# Patient Record
Sex: Female | Born: 1990 | Race: White | Hispanic: No | Marital: Single | Smoking: Current every day smoker
Health system: Southern US, Community
[De-identification: ages and names within clinical notes are randomized; demographics above are authoritative.]

## PROBLEM LIST (undated history)

## (undated) ENCOUNTER — Inpatient Hospital Stay (HOSPITAL_COMMUNITY): Payer: Self-pay

## (undated) DIAGNOSIS — IMO0002 Reserved for concepts with insufficient information to code with codable children: Secondary | ICD-10-CM

## (undated) DIAGNOSIS — O210 Mild hyperemesis gravidarum: Secondary | ICD-10-CM

## (undated) DIAGNOSIS — N730 Acute parametritis and pelvic cellulitis: Secondary | ICD-10-CM

## (undated) DIAGNOSIS — R87619 Unspecified abnormal cytological findings in specimens from cervix uteri: Secondary | ICD-10-CM

## (undated) HISTORY — PX: KNEE SURGERY: SHX244

## (undated) HISTORY — PX: WISDOM TOOTH EXTRACTION: SHX21

## (undated) HISTORY — PX: COLPOSCOPY VULVA W/ BIOPSY: SUR282

---

## 2011-10-08 ENCOUNTER — Emergency Department (HOSPITAL_COMMUNITY): Payer: Medicaid - Out of State

## 2011-10-08 ENCOUNTER — Emergency Department (HOSPITAL_COMMUNITY)
Admission: EM | Admit: 2011-10-08 | Discharge: 2011-10-09 | Disposition: A | Discharge status: Home / Self Care | Payer: Medicaid - Out of State | Attending: Emergency Medicine | Admitting: Emergency Medicine

## 2011-10-08 ENCOUNTER — Encounter (HOSPITAL_COMMUNITY): Payer: Self-pay | Admitting: *Deleted

## 2011-10-08 DIAGNOSIS — F17200 Nicotine dependence, unspecified, uncomplicated: Secondary | ICD-10-CM | POA: Insufficient documentation

## 2011-10-08 DIAGNOSIS — M795 Residual foreign body in soft tissue: Secondary | ICD-10-CM | POA: Insufficient documentation

## 2011-10-08 DIAGNOSIS — Z1889 Other specified retained foreign body fragments: Secondary | ICD-10-CM | POA: Insufficient documentation

## 2011-10-08 DIAGNOSIS — R0789 Other chest pain: Secondary | ICD-10-CM

## 2011-10-08 DIAGNOSIS — R071 Chest pain on breathing: Secondary | ICD-10-CM | POA: Insufficient documentation

## 2011-10-08 DIAGNOSIS — Z789 Other specified health status: Secondary | ICD-10-CM

## 2011-10-08 LAB — PREGNANCY, URINE: Preg Test, Ur: NEGATIVE

## 2011-10-08 MED ORDER — HYDROCODONE-ACETAMINOPHEN 5-325 MG PO TABS: 1.0000 | ORAL_TABLET | ORAL | Status: AC | PRN

## 2011-10-08 MED ORDER — CEPHALEXIN 500 MG PO CAPS: 500.0000 mg | ORAL_CAPSULE | Freq: Four times a day (QID) | ORAL | Status: AC

## 2011-10-08 NOTE — ED Notes (Signed)
Pt. Also c/o infected dermal piercing on left lower back.

## 2011-10-08 NOTE — ED Notes (Signed)
The pt has had lt lower rib pain for 3-4 months no known injury.  She also wants a mole checked on her  Back and she wants a preg test.  lmp aug 7

## 2011-10-08 NOTE — ED Notes (Signed)
Prescriptions x2 given with discharge instructions.  

## 2011-10-08 NOTE — ED Provider Notes (Signed)
History     CSN: [id number]  Arrival date & time 10/08/11  7425   First MD Initiated Contact with Patient 10/08/11 2028      Chief Complaint  Patient presents with  . rib pain mole infected     (Consider location/radiation/quality/duration/timing/severity/associated sxs/prior treatment) HPI History provided by pt.   Pt noticed that her left lower rib cage was sticking out more than the right a few months ago.  She has had progressively worsening pain in this location.  Occasionally radiates up center of chest.  No associated fever, cough or dyspnea.  Denies trauma.  No RF for PE and denies LE pain/edema.  Has a h/o cervical dysplasia but has never been diagnosed w/ cancer.  Also c/o infected piercing of left lower back.  Was pierced 6 months ago. Has had pain for the past several days.  Denies drainage.  Has been told that it will need to be removed by a general surgeon.    History reviewed. No pertinent past medical history.  History reviewed. No pertinent past surgical history.  No family history on file.  History  Substance Use Topics  . Smoking status: Current Everyday Smoker  . Smokeless tobacco: Not on file  . Alcohol Use: Yes    OB History    Grav Para Term Preterm Abortions TAB SAB Ect Mult Living                  Review of Systems  All other systems reviewed and are negative.    Allergies  Ibuprofen and Kiwi extract  Home Medications  No current outpatient prescriptions on file.  BP 114/59  Pulse 112  Temp 98.5 F (36.9 C) (Oral)  Resp 16  SpO2 96%  LMP 09/04/2011  Physical Exam  Nursing note and vitals reviewed. Constitutional: She is oriented to person, place, and time. She appears well-developed and well-nourished. No distress.  HENT:  Head: Normocephalic and atraumatic.  Eyes:       Normal appearance  Neck: Normal range of motion.  Cardiovascular: Normal rate and regular rhythm.   Pulmonary/Chest: Effort normal and breath sounds normal. No  respiratory distress.       No tenderness of chest wall  Abdominal: Soft. Bowel sounds are normal. She exhibits no distension.       Mild tenderness epigastrium only  Musculoskeletal: Normal range of motion. Tenderness: ENtire.       No edema/ttp lower extremities  Neurological: She is alert and oriented to person, place, and time.  Skin: Skin is warm and dry. No rash noted.       No rash.  Stud piercing in left and right of lumbar spine.  Left piercing w/ narrow ring of surrounding erythema.  No drainage.  No induration.  Ttp.   Psychiatric: She has a normal mood and affect. Her behavior is normal.    ED Course  FOREIGN BODY REMOVAL Date/Time: 10/09/2011 7:59 AM Performed by: Otilio Miu Authorized by: Ruby Cola E Consent: Verbal consent obtained. Risks and benefits: risks, benefits and alternatives were discussed Consent given by: patient Patient understanding: patient states understanding of the procedure being performed Body area: skin General location: trunk Location details: back Anesthesia: local infiltration Local anesthetic: lidocaine 2% with epinephrine Patient sedated: no Removal mechanism: scalpel Dressing: dressing applied Depth: subcutaneous Complexity: simple 1 objects recovered. Post-procedure assessment: foreign body removed Patient tolerance: Patient tolerated the procedure well with no immediate complications.   (including critical care time)   Labs  Reviewed  PREGNANCY, URINE   Dg Abd Acute W/chest  10/08/2011  *RADIOLOGY REPORT*  Clinical Data: Left lower rib pain.  Infected piercing in the left lower back.  No injury.  ACUTE ABDOMEN SERIES (ABDOMEN 2 VIEW & CHEST 1 VIEW)  Comparison: None.  Findings: Normal heart size and pulmonary vascularity.  Scattered calcified granulomas in the lungs with calcified hilar and right paratracheal lymph nodes.  No focal airspace consolidation in the lungs.  No blunting of costophrenic angles.  No  pneumothorax.  Normal bowel gas pattern with scattered gas and stool in the colon. No small or large bowel distension.  No radiopaque stones. Metallic foreign bodies projected over the sacral ala bilaterally. Calcifications in the pelvis consistent with phleboliths.  No free air in the abdomen.  No abnormal air fluid levels.  IMPRESSION: Calcified granulomas in the lungs and hilar/mediastinal lymph nodes.  No evidence of active pulmonary disease.  Nonobstructive bowel gas pattern.   Original Report Authenticated By: Marlon Pel, M.D.      1. Body piercing   2. Chest wall pain       MDM  21yo healthy F presents w/ c/o non-traumatic L lower anterior rib pain x months.  No associated sx.  Pain is not reproducible on exam.  Xray shows scattered granulomas and calcified lymph nodes but nothing acute to explain pain.  Recommended PCP f/u.  Also c/o infected left low back piercing.  I made a small incision to remove the piercing and closed loosely w/ single suture.  Signs of worsening infection that should prompt her return were discussed.  D/c'd home w/ 8 vicodin for pain and keflex.          Otilio Miu, [place] 10/09/11 [phone]

## 2011-10-08 NOTE — ED Provider Notes (Signed)
21 year old female with an infection around a piercing in the left lower back. On exam, there is moderate erythema surrounding the piercing but no significant swelling and no drainage. There is quite tender to palpation. X-ray shows that there is a cross piece of the study is attached to. Patient is advised that a small incision would need to be made to remove the piercing. She states that she does not wish to have that done today. She is advised the infection will not get better until the piercing was removed. She states she is aware that still wishes to go home without the piercing been removed. She will be referred to general surgery for followup. She'll be placed on antibiotics.  Kristie Booze, MD 10/08/11 [phone]

## 2011-10-08 NOTE — ED Notes (Signed)
Pt. To x-ray .

## 2011-10-09 NOTE — ED Provider Notes (Signed)
Medical screening examination/treatment/procedure(s) were conducted as a shared visit with non-physician practitioner(s) and myself.  I personally evaluated the patient during the encounter   Dione Booze, MD 10/09/11 2355

## 2011-10-22 ENCOUNTER — Encounter (HOSPITAL_COMMUNITY): Payer: Self-pay | Admitting: *Deleted

## 2011-10-22 ENCOUNTER — Emergency Department (HOSPITAL_COMMUNITY)
Admission: EM | Admit: 2011-10-22 | Discharge: 2011-10-22 | Disposition: A | Discharge status: Home / Self Care | Payer: Medicaid - Out of State | Attending: Emergency Medicine | Admitting: Emergency Medicine

## 2011-10-22 DIAGNOSIS — F17200 Nicotine dependence, unspecified, uncomplicated: Secondary | ICD-10-CM | POA: Insufficient documentation

## 2011-10-22 DIAGNOSIS — R109 Unspecified abdominal pain: Secondary | ICD-10-CM | POA: Insufficient documentation

## 2011-10-22 LAB — URINALYSIS, ROUTINE W REFLEX MICROSCOPIC
Bilirubin Urine: NEGATIVE
Ketones, ur: NEGATIVE mg/dL
Nitrite: NEGATIVE
Urobilinogen, UA: 0.2 mg/dL (ref 0.0–1.0)
pH: 7 (ref 5.0–8.0)

## 2011-10-22 NOTE — ED Notes (Addendum)
Patient had piercing removed a few weeks that was on her backgo and was given antibiotics and now she is experiencing left side pain and right side pain.  Patient also c/o shortness of breath.

## 2011-10-22 NOTE — ED Provider Notes (Signed)
History     CSN: [id number]  Arrival date & time 10/22/11  1842   First MD Initiated Contact with Patient 10/22/11 2004      Chief Complaint  Patient presents with  . Flank Pain   HPI  History provided by the patient. History is limited given patient's unwillingness to be fully evaluated. Patient is a 21 year old female with no significant PMH who presents with complaints of left sided back pain. Patient reports having symptoms off and on for the past several weeks. She denies any injury trauma to the area. She reports that pain is worse with deep breathing. She denies feeling symptoms of shortness of breath. Denies any skin changes. She denies any fever, chills or sweats. She denies any urinary changes. Denies any diarrhea or constipation. Patient has not used any treatment for symptoms. She denies any aggravating or alleviating factors.    History reviewed. No pertinent past medical history.  Past Surgical History  Procedure Date  . Knee surgery     right side    History reviewed. No pertinent family history.  History  Substance Use Topics  . Smoking status: Current Every Day Smoker -- 0.5 packs/day    Types: Cigarettes  . Smokeless tobacco: Not on file  . Alcohol Use: Yes    OB History    Grav Para Term Preterm Abortions TAB SAB Ect Mult Living                  Review of Systems  Constitutional: Negative for fever and chills.  Respiratory: Negative for shortness of breath.   Cardiovascular: Positive for chest pain.  Gastrointestinal: Positive for abdominal pain. Negative for nausea, vomiting, diarrhea and constipation.  Genitourinary: Positive for flank pain. Negative for dysuria, frequency and hematuria.  Musculoskeletal: Positive for back pain.    Allergies  Ibuprofen and Kiwi extract  Home Medications  No current outpatient prescriptions on file.  BP 122/67  Pulse 97  Temp 97.9 F (36.6 C) (Oral)  SpO2 100%  LMP 09/04/2011  Physical Exam  Nursing  note and vitals reviewed. Constitutional: She is oriented to person, place, and time. She appears well-developed and well-nourished. No distress.  HENT:  Head: Normocephalic.  Cardiovascular: Normal rate and regular rhythm.   No murmur heard. Pulmonary/Chest: Effort normal and breath sounds normal. No respiratory distress. She has no wheezes. She has no rales.       Mild left lower lateral rib tenderness without deformity or step-off. Skin normal without rash or bruising.  Abdominal: Soft. There is tenderness in the left upper quadrant and left lower quadrant. There is no rebound and no guarding.       No significant CVA tenderness.  Musculoskeletal: She exhibits no edema and no tenderness.       No clinical signs for DVT  Neurological: She is alert and oriented to person, place, and time.  Skin: Skin is warm and dry. No rash noted.  Psychiatric: She has a normal mood and affect. Her behavior is normal.    ED Course  Procedures    Labs Reviewed  URINALYSIS, ROUTINE W REFLEX MICROSCOPIC   No results found.   1. Left flank pain       MDM  9:15PM patient seen and evaluated. I discussed options for further evaluation of patient's chronic symptoms however she declines and does not wish to stay for further evaluation. Patient states she needs to return home and catch the bus. I discussed with patient rare but possible  concerning cause of symptoms including PE, kidney stones, kidney or pancreas problems or infections. She understands that the risks of some of these conditions could be severe including life-threatening. Patient was advised she may return at anytime for reevaluation of symptoms.       Angus Seller, [place] 10/22/11 2219

## 2011-10-22 NOTE — ED Notes (Signed)
Patient left AMA. Was not willing to wait for provider, D[place]en PA made aware.

## 2011-10-23 ENCOUNTER — Emergency Department (HOSPITAL_COMMUNITY)
Admission: EM | Admit: 2011-10-23 | Discharge: 2011-10-23 | Disposition: A | Discharge status: Home / Self Care | Payer: Medicaid - Out of State | Attending: Emergency Medicine | Admitting: Emergency Medicine

## 2011-10-23 ENCOUNTER — Encounter (HOSPITAL_COMMUNITY): Payer: Self-pay | Admitting: Emergency Medicine

## 2011-10-23 ENCOUNTER — Emergency Department (HOSPITAL_COMMUNITY): Payer: Medicaid - Out of State

## 2011-10-23 DIAGNOSIS — F17200 Nicotine dependence, unspecified, uncomplicated: Secondary | ICD-10-CM | POA: Insufficient documentation

## 2011-10-23 DIAGNOSIS — R091 Pleurisy: Secondary | ICD-10-CM

## 2011-10-23 DIAGNOSIS — Z91018 Allergy to other foods: Secondary | ICD-10-CM | POA: Insufficient documentation

## 2011-10-23 DIAGNOSIS — Z888 Allergy status to other drugs, medicaments and biological substances status: Secondary | ICD-10-CM | POA: Insufficient documentation

## 2011-10-23 MED ORDER — HYDROCODONE-ACETAMINOPHEN 5-325 MG PO TABS
2.0000 | ORAL_TABLET | Freq: Once | ORAL | Status: AC
  Administered 2011-10-23: 2 via ORAL
  Filled 2011-10-23: qty 2

## 2011-10-23 MED ORDER — HYDROCODONE-ACETAMINOPHEN 5-325 MG PO TABS: 1.0000 | ORAL_TABLET | ORAL | Status: DC | PRN

## 2011-10-23 NOTE — ED Notes (Addendum)
Pt states that she has had pain upon inspiration for the past week.  States it gets better when she smokes a cigarette.  Went to [facility] twice in the past week.  States she was unhappy with her care because they did a urine sample and it didn't have anything to do with her urine.  Pt has no visible distress.  Calmly talking in triage.  C/o pain 9/10.

## 2011-10-23 NOTE — ED Provider Notes (Signed)
Medical screening examination/treatment/procedure(s) were performed by non-physician practitioner and as supervising physician I was immediately available for consultation/collaboration.  Gerhard Munch, MD 10/23/11 [phone]

## 2011-10-23 NOTE — ED Notes (Signed)
Pt with pain in her chest when breathing in and out.  She has had this pain for one and one half weeks.  Pt is a smoker 1/2 ppd.  No history of asthma.  Denies fever, chills but says she has felt hot lately?  Pt coughing up greenish sputum.  Denies nausea, vomiting.

## 2011-10-23 NOTE — ED Provider Notes (Signed)
History     CSN: [id number]  Arrival date & time 10/23/11  1755   First MD Initiated Contact with Patient 10/23/11 2006      Chief Complaint  Patient presents with  . Shortness of Breath    (Consider location/radiation/quality/duration/timing/severity/associated sxs/prior treatment) HPI History provided by pt and prior chart.  Pt presents w/ 1.5 weeks of constant, sharp, pleuritic left anterior lower chest pain.  Has had this pain intermittently for several months but before this episode, has resolved spontaneously in a short amount of time.   Associated w/ cough x 1 month.  Denies fever, SOB, palpitations, back pain, urinary sx, abd pain, N/V/D, LE pain edema.  Has had some relief w/ vicodin that was prescribed to a friend but the only thing that resolves the pain is smoking a cigarette.  Pain is not aggravated by movement or position.  No RF for PE.  Denies trauma/heavy lifting.  Per prior chart, pt seen for same compliant in ED yesterday but refused to be completely evaluated.  She states that the PA checked her urine and her pain has nothing to do with her urinary tract.    History reviewed. No pertinent past medical history.  Past Surgical History  Procedure Date  . Knee surgery     right side    History reviewed. No pertinent family history.  History  Substance Use Topics  . Smoking status: Current Every Day Smoker -- 0.5 packs/day    Types: Cigarettes  . Smokeless tobacco: Not on file  . Alcohol Use: Yes    OB History    Grav Para Term Preterm Abortions TAB SAB Ect Mult Living                  Review of Systems  All other systems reviewed and are negative.    Allergies  Ibuprofen and Kiwi extract  Home Medications  No current outpatient prescriptions on file.  BP 106/56  Pulse 69  Temp 98.6 F (37 C) (Oral)  Resp 16  SpO2 98%  LMP 10/11/2011  Physical Exam  Nursing note and vitals reviewed. Constitutional: She is oriented to person, place, and  time. She appears well-developed and well-nourished. No distress.       Pt is holding her left side and is talking quietly as if she is afraid it will increase her pain.   HENT:  Head: Normocephalic and atraumatic.  Eyes:       Normal appearance  Neck: Normal range of motion.  Cardiovascular: Normal rate and regular rhythm.   Pulmonary/Chest: Breath sounds normal. No respiratory distress. She exhibits no tenderness.       Exam limited because patient can not take a deep breath.  No bruising or other skin changes.   Abdominal: Soft. Bowel sounds are normal. She exhibits no distension and no mass. There is no tenderness. There is no rebound and no guarding.  Genitourinary:       No CVA tenderness  Musculoskeletal: Normal range of motion.       No LE edema or tenderness  Neurological: She is alert and oriented to person, place, and time.  Skin: Skin is warm and dry. No rash noted.  Psychiatric: She has a normal mood and affect. Her behavior is normal.    ED Course  Procedures (including critical care time)   Date: 10/23/2011  Rate: 66  Rhythm: normal sinus rhythm  QRS Axis: normal  Intervals: normal  ST/T Wave abnormalities: normal  Conduction Disutrbances:none  Narrative Interpretation:   Old EKG Reviewed: none available    Labs Reviewed  D-DIMER, QUANTITATIVE   Dg Chest 2 View  10/23/2011  *RADIOLOGY REPORT*  Clinical Data: 21 year old female with left side pain.  Shortness of breath.  CHEST - 2 VIEW  Comparison: 10/08/2011.  Findings: Scattered pulmonary calcified granulomas and small calcified lymph node again noted.  Slightly more prominent calcified right perihilar node again noted. Other mediastinal contours are within normal limits.  Visualized tracheal air column is within normal limits.  No pneumothorax, pulmonary edema, pleural effusion or confluent pulmonary opacity. No acute osseous abnormality identified.  IMPRESSION: Previous thoracic granulomatous disease. No acute  cardiopulmonary abnormality.   Original Report Authenticated By: Harley Hallmark, M.D.      1. Pleurisy       MDM  Healthy 21yo F presents w/ non-traumatic left anterior chest pain that is sharp and pleuritic.  No associated sx.  No RF for or exam findings consistent w/ PE.  Will r/o with d-dimer.  CXR pending to r/o pneumonia, pneumothorax, rib fracture.  EKG has been ordered as well.  No GI sx and abd benign.  Pt to receive 2 vicodin for pain.  8:48 PM    EKG non-ischemic and shows NSR.  CXR stable and d-dimer neg.  All results discussed w/ pt.  She reports that pain has improved w/ hydrocodone.  Will treat w/ NSAIDs for pleurisy and recommend rest and PCP f/u.  Return precautions discussed.         Otilio Miu, [place] 10/24/11 [phone]

## 2011-10-25 NOTE — ED Provider Notes (Signed)
Medical screening examination/treatment/procedure(s) were performed by non-physician practitioner and as supervising physician I was immediately available for consultation/collaboration.  Raeford Razor, MD 10/25/11 1204

## 2011-11-10 ENCOUNTER — Encounter (HOSPITAL_COMMUNITY): Payer: Self-pay | Admitting: Emergency Medicine

## 2011-11-10 ENCOUNTER — Emergency Department (INDEPENDENT_AMBULATORY_CARE_PROVIDER_SITE_OTHER)
Admission: EM | Admit: 2011-11-10 | Discharge: 2011-11-10 | Disposition: A | Discharge status: Home / Self Care | Payer: Medicaid - Out of State | Source: Home / Self Care

## 2011-11-10 DIAGNOSIS — R079 Chest pain, unspecified: Secondary | ICD-10-CM

## 2011-11-10 DIAGNOSIS — J984 Other disorders of lung: Secondary | ICD-10-CM

## 2011-11-10 MED ORDER — KETOROLAC TROMETHAMINE 60 MG/2ML IM SOLN
60.0000 mg | Freq: Once | INTRAMUSCULAR | Status: AC
  Administered 2011-11-10: 60 mg via INTRAMUSCULAR

## 2011-11-10 MED ORDER — TRAMADOL HCL 50 MG PO TABS: 50.0000 mg | ORAL_TABLET | Freq: Four times a day (QID) | ORAL | Status: DC | PRN

## 2011-11-10 MED ORDER — KETOROLAC TROMETHAMINE 60 MG/2ML IM SOLN
INTRAMUSCULAR | Status: AC
  Filled 2011-11-10: qty 2

## 2011-11-10 NOTE — ED Provider Notes (Signed)
Medical screening examination/treatment/procedure(s) were performed by resident physician or non-physician practitioner and as supervising physician I was immediately available for consultation/collaboration.   Barkley Bruns MD.    Linna Hoff, MD 11/10/11 [phone]

## 2011-11-10 NOTE — ED Notes (Signed)
Pt has had left chest pain x several months. Has been to [facility] X2 and [facility] x 2 for same. States gets pain meds but does not get any better.

## 2011-11-10 NOTE — ED Provider Notes (Signed)
History     CSN: [id number]  Arrival date & time 11/10/11  0951   None     Chief Complaint  Patient presents with  . Chest Pain    (Consider location/radiation/quality/duration/timing/severity/associated sxs/prior treatment) HPI Comments: 21 year old female who presents with pain in the left chest. She's had this complaint for several months and is visited [facility] twice in [place] twice and has had appropriate workups. Including chest x-rays she states that the discomfort is getting worse she has difficulty sleeping at night. Pain is located in the upper chest the lower left chest wall the costal margin pain in the right upper back. Pain also hurts when she breathes. She denies urinary symptoms shortness of breath increasing cough or hemoptysis. She is a smoker and does have early morning cough with clear sputum. It is noted on previous chest x-rays that she had a large mediastinal lymph node and some granulomas.  Patient is a 21 y.o. female presenting with chest pain.  Chest Pain Primary symptoms include fatigue and cough. Pertinent negatives for primary symptoms include no fever, no shortness of breath, no wheezing and no palpitations.     History reviewed. No pertinent past medical history.  Past Surgical History  Procedure Date  . Knee surgery     right side    No family history on file.  History  Substance Use Topics  . Smoking status: Current Every Day Smoker -- 0.5 packs/day    Types: Cigarettes  . Smokeless tobacco: Not on file  . Alcohol Use: Yes    OB History    Grav Para Term Preterm Abortions TAB SAB Ect Mult Living                  Review of Systems  Constitutional: Positive for fatigue. Negative for fever and activity change.  HENT: Negative.   Respiratory: Positive for cough. Negative for choking, chest tightness, shortness of breath and wheezing.   Cardiovascular: Positive for chest pain. Negative for palpitations.  Gastrointestinal:  Negative.   Genitourinary: Negative.   Musculoskeletal: Negative for back pain and joint swelling.  Skin: Negative.     Allergies  Ibuprofen and Kiwi extract  Home Medications   Current Outpatient Rx  Name Route Sig Dispense Refill  . HYDROCODONE-ACETAMINOPHEN 5-325 MG PO TABS Oral Take 1 tablet by mouth every 4 (four) hours as needed for pain. 12 tablet 0  . TRAMADOL HCL 50 MG PO TABS Oral Take 1 tablet (50 mg total) by mouth every 6 (six) hours as needed for pain. 25 tablet 0    BP 111/66  Pulse 88  Temp 98.1 F (36.7 C) (Oral)  Resp 18  SpO2 100%  LMP 10/11/2011  Physical Exam  Constitutional: She is oriented to person, place, and time. She appears well-developed and well-nourished. No distress.  HENT:  Right Ear: External ear normal.  Left Ear: External ear normal.  Mouth/Throat: Oropharynx is clear and moist. No oropharyngeal exudate.  Eyes: Conjunctivae normal and EOM are normal.  Neck: Normal range of motion. Neck supple.       For flexion is limited due to pain in the right posterior neck along the splenius capitis muscle.  Cardiovascular: Normal rate, regular rhythm and normal heart sounds.   No murmur heard. Pulmonary/Chest: Effort normal and breath sounds normal. No respiratory distress. She has no wheezes. She exhibits no tenderness.  Abdominal: Soft. She exhibits no distension and no mass. There is no tenderness. There is no  rebound and no guarding.  Musculoskeletal: Normal range of motion. She exhibits no edema and no tenderness.  Lymphadenopathy:    She has no cervical adenopathy.  Neurological: She is alert and oriented to person, place, and time. No cranial nerve deficit.  Skin: Skin is warm and dry. No erythema.    ED Course  Procedures (including critical care time)  Labs Reviewed - No data to display No results found.   1. Chest pain at rest   2. Pulmonary disorder       MDM  No results found. Her physical exam is essentially normal. I  am unable to reproduce any of the chest or chest wall pain she presents. She has no abdominal pain or tenderness. Reviewing the previous [facility] recent chest x-rays she does have a granulomatous formations which is the only clue that may have something to do with her discomfort. Or we'll refer her to Dr. Sandrea Hughs. Ultram 50 mg every 4 hours when necessary pain        Hayden Rasmussen, NP 11/10/11 1112

## 2011-11-22 ENCOUNTER — Institutional Professional Consult (permissible substitution): Payer: Medicaid - Out of State | Admitting: Internal Medicine

## 2011-12-03 ENCOUNTER — Encounter (HOSPITAL_COMMUNITY): Payer: Self-pay | Admitting: *Deleted

## 2011-12-03 ENCOUNTER — Inpatient Hospital Stay (HOSPITAL_COMMUNITY): Payer: Medicaid - Out of State

## 2011-12-03 ENCOUNTER — Inpatient Hospital Stay (HOSPITAL_COMMUNITY)
Admission: AD | Admit: 2011-12-03 | Discharge: 2011-12-04 | DRG: 759 | Discharge status: Against Medical Advice | Payer: Medicaid - Out of State | Source: Ambulatory Visit | Attending: Obstetrics & Gynecology | Admitting: Obstetrics & Gynecology

## 2011-12-03 DIAGNOSIS — R109 Unspecified abdominal pain: Secondary | ICD-10-CM | POA: Diagnosis present

## 2011-12-03 DIAGNOSIS — B9689 Other specified bacterial agents as the cause of diseases classified elsewhere: Secondary | ICD-10-CM | POA: Diagnosis present

## 2011-12-03 DIAGNOSIS — A499 Bacterial infection, unspecified: Secondary | ICD-10-CM | POA: Diagnosis present

## 2011-12-03 DIAGNOSIS — N760 Acute vaginitis: Secondary | ICD-10-CM | POA: Diagnosis present

## 2011-12-03 DIAGNOSIS — N730 Acute parametritis and pelvic cellulitis: Secondary | ICD-10-CM | POA: Diagnosis present

## 2011-12-03 DIAGNOSIS — N7093 Salpingitis and oophoritis, unspecified: Secondary | ICD-10-CM

## 2011-12-03 LAB — WET PREP, GENITAL
Trich, Wet Prep: NONE SEEN
Yeast Wet Prep HPF POC: NONE SEEN

## 2011-12-03 LAB — CBC WITH DIFFERENTIAL/PLATELET
Basophils Absolute: 0 K/uL (ref 0.0–0.1)
Basophils Relative: 0 % (ref 0–1)
Eosinophils Absolute: 0.1 K/uL (ref 0.0–0.7)
Eosinophils Relative: 1 % (ref 0–5)
HCT: 38.9 % (ref 36.0–46.0)
Hemoglobin: 13.4 g/dL (ref 12.0–15.0)
Lymphocytes Relative: 8 % — ABNORMAL LOW (ref 12–46)
Lymphs Abs: 1.2 K/uL (ref 0.7–4.0)
MCH: 30 pg (ref 26.0–34.0)
MCHC: 34.4 g/dL (ref 30.0–36.0)
MCV: 87.2 fL (ref 78.0–100.0)
Monocytes Absolute: 1 K/uL (ref 0.1–1.0)
Monocytes Relative: 6 % (ref 3–12)
Neutro Abs: 13.2 K/uL — ABNORMAL HIGH (ref 1.7–7.7)
Neutrophils Relative %: 85 % — ABNORMAL HIGH (ref 43–77)
Platelets: 248 K/uL (ref 150–400)
RBC: 4.46 MIL/uL (ref 3.87–5.11)
RDW: 13.7 % (ref 11.5–15.5)
WBC: 15.5 K/uL — ABNORMAL HIGH (ref 4.0–10.5)

## 2011-12-03 LAB — RAPID URINE DRUG SCREEN, HOSP PERFORMED
Amphetamines: NOT DETECTED
Barbiturates: NOT DETECTED
Benzodiazepines: NOT DETECTED
Cocaine: NOT DETECTED

## 2011-12-03 LAB — COMPREHENSIVE METABOLIC PANEL
ALT: 7 U/L (ref 0–35)
AST: 9 U/L (ref 0–37)
Albumin: 3.6 g/dL (ref 3.5–5.2)
Calcium: 9.4 mg/dL (ref 8.4–10.5)
Sodium: 137 mEq/L (ref 135–145)
Total Protein: 7.4 g/dL (ref 6.0–8.3)

## 2011-12-03 LAB — HCG, QUANTITATIVE, PREGNANCY: hCG, Beta Chain, Quant, S: <1 m[IU]/mL (ref <5)

## 2011-12-03 LAB — URINALYSIS, ROUTINE W REFLEX MICROSCOPIC
Glucose, UA: NEGATIVE mg/dL
Hgb urine dipstick: NEGATIVE
Protein, ur: NEGATIVE mg/dL

## 2011-12-03 MED ORDER — ONDANSETRON HCL 4 MG PO TABS: 4.0000 mg | ORAL_TABLET | Freq: Four times a day (QID) | ORAL | Status: DC | PRN

## 2011-12-03 MED ORDER — ONDANSETRON HCL 4 MG/2ML IJ SOLN
4.0000 mg | Freq: Four times a day (QID) | INTRAMUSCULAR | Status: DC | PRN
  Administered 2011-12-03 – 2011-12-04 (×4): 4 mg via INTRAVENOUS
  Filled 2011-12-03 (×4): qty 2

## 2011-12-03 MED ORDER — LACTATED RINGERS IV SOLN
INTRAVENOUS | Status: DC
  Administered 2011-12-03 – 2011-12-04 (×3): via INTRAVENOUS

## 2011-12-03 MED ORDER — METRONIDAZOLE 500 MG PO TABS
500.0000 mg | ORAL_TABLET | Freq: Two times a day (BID) | ORAL | Status: DC
  Administered 2011-12-03 – 2011-12-04 (×3): 500 mg via ORAL
  Filled 2011-12-03 (×5): qty 1

## 2011-12-03 MED ORDER — DIPHENHYDRAMINE HCL 12.5 MG/5ML PO ELIX
12.5000 mg | ORAL_SOLUTION | Freq: Four times a day (QID) | ORAL | Status: DC | PRN
  Filled 2011-12-03: qty 5

## 2011-12-03 MED ORDER — DOXYCYCLINE HYCLATE 100 MG PO TABS
100.0000 mg | ORAL_TABLET | Freq: Two times a day (BID) | ORAL | Status: DC
  Administered 2011-12-03 – 2011-12-04 (×3): 100 mg via ORAL
  Filled 2011-12-03 (×5): qty 1

## 2011-12-03 MED ORDER — ZOLPIDEM TARTRATE 5 MG PO TABS: 5.0000 mg | ORAL_TABLET | Freq: Every evening | ORAL | Status: DC | PRN

## 2011-12-03 MED ORDER — ONDANSETRON HCL 4 MG/2ML IJ SOLN
4.0000 mg | Freq: Once | INTRAMUSCULAR | Status: AC
  Administered 2011-12-03: 4 mg via INTRAVENOUS
  Filled 2011-12-03: qty 2

## 2011-12-03 MED ORDER — HYDROMORPHONE HCL PF 1 MG/ML IJ SOLN
1.0000 mg | Freq: Once | INTRAMUSCULAR | Status: AC
  Administered 2011-12-03: 1 mg via INTRAVENOUS
  Filled 2011-12-03: qty 1

## 2011-12-03 MED ORDER — HYDROMORPHONE 0.3 MG/ML IV SOLN
INTRAVENOUS | Status: DC
  Administered 2011-12-03: 1.79 mg via INTRAVENOUS
  Administered 2011-12-03: 8 mg via INTRAVENOUS
  Administered 2011-12-03: 0.599 mg via INTRAVENOUS
  Administered 2011-12-03: 10:00:00 via INTRAVENOUS
  Administered 2011-12-04: 3.33 mg via INTRAVENOUS
  Administered 2011-12-04: 0.2 mg via INTRAVENOUS
  Filled 2011-12-03: qty 25

## 2011-12-03 MED ORDER — DIPHENHYDRAMINE HCL 50 MG/ML IJ SOLN: 12.5000 mg | Freq: Four times a day (QID) | INTRAMUSCULAR | Status: DC | PRN

## 2011-12-03 MED ORDER — ALUM & MAG HYDROXIDE-SIMETH 200-200-20 MG/5ML PO SUSP: 30.0000 mL | ORAL | Status: DC | PRN

## 2011-12-03 MED ORDER — DEXTROSE 5 % IV SOLN
2.0000 g | INTRAVENOUS | Status: DC
  Administered 2011-12-03: 2 g via INTRAVENOUS
  Filled 2011-12-03 (×2): qty 2

## 2011-12-03 MED ORDER — LACTATED RINGERS IV BOLUS (SEPSIS)
1000.0000 mL | Freq: Once | INTRAVENOUS | Status: AC
  Administered 2011-12-03: 1000 mL via INTRAVENOUS

## 2011-12-03 MED ORDER — SODIUM CHLORIDE 0.9 % IJ SOLN: 9.0000 mL | INTRAMUSCULAR | Status: DC | PRN

## 2011-12-03 MED ORDER — DOCUSATE SODIUM 100 MG PO CAPS
100.0000 mg | ORAL_CAPSULE | Freq: Two times a day (BID) | ORAL | Status: DC
  Administered 2011-12-03 – 2011-12-04 (×2): 100 mg via ORAL
  Filled 2011-12-03 (×3): qty 1

## 2011-12-03 MED ORDER — NALOXONE HCL 0.4 MG/ML IJ SOLN: 0.4000 mg | INTRAMUSCULAR | Status: DC | PRN

## 2011-12-03 NOTE — MAU Provider Note (Signed)
History     CSN: [id number]  Arrival date and time: 12/03/11 0246   None     No chief complaint on file.  HPI Kristie Williams is a 21 y.o. female who presents to MAU via EMS with abdominal pain. The pain started 2 days ago. She rates the pain as 10/10. The pain is located over the entire abdomen. Associated symptoms include nausea. She denies vaginal bleeding. The history was provided by the patient.  OB History    Grav Para Term Preterm Abortions TAB SAB Ect Mult Living                  History reviewed. No pertinent past medical history.  Past Surgical History  Procedure Date  . Knee surgery     right side    History reviewed. No pertinent family history.  History  Substance Use Topics  . Smoking status: Current Every Day Smoker -- 0.5 packs/day    Types: Cigarettes  . Smokeless tobacco: Not on file  . Alcohol Use: Yes    Allergies:  Allergies  Allergen Reactions  . Ibuprofen Other (See Comments)    migraine   . Kiwi Extract Swelling    Prescriptions prior to admission  Medication Sig Dispense Refill  . aspirin-acetaminophen-caffeine (EXCEDRIN MIGRAINE) 250-250-65 MG per tablet Take 2 tablets by mouth every 8 (eight) hours as needed.      . traMADol (ULTRAM) 50 MG tablet Take 1 tablet (50 mg total) by mouth every 6 (six) hours as needed for pain.  25 tablet  0  . HYDROcodone-acetaminophen (NORCO/VICODIN) 5-325 MG per tablet Take 1 tablet by mouth every 4 (four) hours as needed for pain.  12 tablet  0    ROS Physical Exam   Blood pressure 101/64, pulse 84, temperature 98.4 F (36.9 C), temperature source Oral, resp. rate 20, last menstrual period 11/11/2011, SpO2 100.00%.  Physical Exam  Nursing note and vitals reviewed. Constitutional: She is oriented to person, place, and time. No distress.       Thin, white female. Appears very uncomfortable. Lying on exam bed in fetal position holding lower abdomen.  HENT:  Head: Normocephalic and atraumatic.    Eyes: EOM are normal.  Neck: Neck supple.  Cardiovascular: Normal rate.   Respiratory: Effort normal.  GI: Soft. There is no tenderness.  Genitourinary:       External genitalia without lesions. Frothy yellow discharge vaginal vault. Cervix inflamed. Positive CMT, bilateral adnexal tenderness. Uterus without palpable enlargement.  Musculoskeletal: Normal range of motion.  Neurological: She is alert and oriented to person, place, and time.  Skin: Skin is warm and dry.  Psychiatric: She has a normal mood and affect. Her behavior is normal. Judgment and thought content normal.   Procedures Results for orders placed during the hospital encounter of 12/03/11 (from the past 24 hour(s))  URINALYSIS, ROUTINE W REFLEX MICROSCOPIC     Status: Normal   Collection Time   12/03/11  3:00 AM      Component Value Range   Color, Urine YELLOW  YELLOW   APPearance CLEAR  CLEAR   Specific Gravity, Urine 1.015  1.005 - 1.030   pH 7.0  5.0 - 8.0   Glucose, UA NEGATIVE  NEGATIVE mg/dL   Hgb urine dipstick NEGATIVE  NEGATIVE   Bilirubin Urine NEGATIVE  NEGATIVE   Ketones, ur NEGATIVE  NEGATIVE mg/dL   Protein, ur NEGATIVE  NEGATIVE mg/dL   Urobilinogen, UA 0.2  0.0 - 1.0 mg/dL  Nitrite NEGATIVE  NEGATIVE   Leukocytes, UA NEGATIVE  NEGATIVE  URINE RAPID DRUG SCREEN (HOSP PERFORMED)     Status: Abnormal   Collection Time   12/03/11  3:00 AM      Component Value Range   Opiates NONE DETECTED  NONE DETECTED   Cocaine NONE DETECTED  NONE DETECTED   Benzodiazepines NONE DETECTED  NONE DETECTED   Amphetamines NONE DETECTED  NONE DETECTED   Tetrahydrocannabinol POSITIVE (*) NONE DETECTED   Barbiturates NONE DETECTED  NONE DETECTED  CBC WITH DIFFERENTIAL     Status: Abnormal   Collection Time   12/03/11  3:25 AM      Component Value Range   WBC 15.5 (*) 4.0 - 10.5 K/uL   RBC 4.46  3.87 - 5.11 MIL/uL   Hemoglobin 13.4  12.0 - 15.0 g/dL   HCT 81.1  91.4 - 78.2 %   MCV 87.2  78.0 - 100.0 fL   MCH 30.0   26.0 - 34.0 pg   MCHC 34.4  30.0 - 36.0 g/dL   RDW 95.6  21.3 - 08.6 %   Platelets 248  150 - 400 K/uL   Neutrophils Relative 85 (*) 43 - 77 %   Neutro Abs 13.2 (*) 1.7 - 7.7 K/uL   Lymphocytes Relative 8 (*) 12 - 46 %   Lymphs Abs 1.2  0.7 - 4.0 K/uL   Monocytes Relative 6  3 - 12 %   Monocytes Absolute 1.0  0.1 - 1.0 K/uL   Eosinophils Relative 1  0 - 5 %   Eosinophils Absolute 0.1  0.0 - 0.7 K/uL   Basophils Relative 0  0 - 1 %   Basophils Absolute 0.0  0.0 - 0.1 K/uL  HCG, QUANTITATIVE, PREGNANCY     Status: Normal   Collection Time   12/03/11  3:25 AM      Component Value Range   hCG, Beta Chain, Quant, S <1  <5 mIU/mL    Ultrasound  Shows possible bilateral pyosalpinx  Assessment: 21 y.o. female with pelvic pain   PID   Plan:  IV LR   Dilaudid 1 mg IV   Admit for IV antibiotics and pain management  Patient had some relief with Dilaudid but required a second dose after having the ultrasound.    Given elevated WBC with left shift and ultrasound finding will treat for PID   Admission orders done   Discussed with Dr. Clearance Coots, RN, FNP, [facility] 12/03/2011, 6:13 AM

## 2011-12-03 NOTE — Plan of Care (Signed)
Problem: Phase I Progression Outcomes Goal: Pain controlled with appropriate interventions Outcome: Progressing Patient with reduced dose Dilaudid PCA pump

## 2011-12-04 ENCOUNTER — Encounter (HOSPITAL_COMMUNITY): Payer: Self-pay | Admitting: *Deleted

## 2011-12-04 DIAGNOSIS — N730 Acute parametritis and pelvic cellulitis: Principal | ICD-10-CM

## 2011-12-04 DIAGNOSIS — N7093 Salpingitis and oophoritis, unspecified: Secondary | ICD-10-CM

## 2011-12-04 MED ORDER — METRONIDAZOLE 500 MG PO TABS: 500.0000 mg | ORAL_TABLET | Freq: Two times a day (BID) | ORAL | Status: DC

## 2011-12-04 MED ORDER — DOXYCYCLINE HYCLATE 100 MG PO TABS: 100.0000 mg | ORAL_TABLET | Freq: Two times a day (BID) | ORAL | Status: DC

## 2011-12-04 MED ORDER — OXYCODONE-ACETAMINOPHEN 5-325 MG PO TABS: 1.0000 | ORAL_TABLET | ORAL | Status: DC | PRN

## 2011-12-04 NOTE — Discharge Summary (Signed)
  Physician Discharge Summary   Patient ID: [name] [id number] 21 y.o. 07-08-1990  Admit date: 12/03/2011  Discharge date and time: 12/04/2011  8:00 PM   Admitting Physician: Lesly Dukes, MD   Discharge Physician: Christin Bach  Admission Diagnoses: ABD PAIN  Discharge Diagnoses: PID, nongonococcal  Admission Condition: fairHPI Kristie Williams is a 21 y.o. female who presents to MAU via EMS with abdominal pain. The pain started 2 days ago. She rates the pain as 10/10. The pain is located over the entire abdomen. Associated symptoms include nausea. She denies vaginal bleeding. The history was provided by the patient.    Discharged Condition: good  Indication for Admission: Acute pain from suspected infection, requiring IV antibioitics  Hospital Course: 2 days of antibiotics IV, then patient was being considered for discharge in morning, but she decided to leave ama on the evening of 12/04/11.  The restricted access to cigarettes was thought to be a factor in decision. Pt discharge encouraged by pt's partner.  Consults: None  Significant Diagnostic Studies: microbiology: wet prep +for BV  Treatments: IV hydration and antibiotics: ceftriaxone and metronidazole  Discharge Exam: noe exam due to [facility] discharge.  Disposition: 07-Left Against Medical Advice  Patient Instructions:    Medication List     As of 12/04/2011  8:57 PM    TAKE these medications         doxycycline 100 MG tablet   Commonly known as: VIBRA-TABS   Take 1 tablet (100 mg total) by mouth every 12 (twelve) hours.      metroNIDAZOLE 500 MG tablet   Commonly known as: FLAGYL   Take 1 tablet (500 mg total) by mouth every 12 (twelve) hours.      traMADol 50 MG tablet   Commonly known as: ULTRAM   Take 1 tablet (50 mg total) by mouth every 6 (six) hours as needed for pain.       Activity: no sex x 2 weeks Wound Care: n/a RX'S CALLED TO PHARMACY OF RECORD Follow-up with *gyn clinic in   2wk  Signed: Tilda Burrow 12/04/2011 8:57 PM

## 2011-12-04 NOTE — Progress Notes (Signed)
Ur chart review completed.  

## 2011-12-04 NOTE — Plan of Care (Signed)
Problem: Phase II Progression Outcomes Goal: Discharge plan established Outcome: Not Met (add Reason) Patient left AMA. Goal: Obtain order to discontinue catheter if appropriate Outcome: Not Applicable Date Met:  12/04/11 Patient left AMA  Comments:  Patient left AMA

## 2011-12-04 NOTE — Progress Notes (Signed)
Subjective: Patient reports that she insists on leaving, She has signed out AMA, and IV removed prior to discharge , by RN.     Objective: I have reviewed patient's medications, labs and microbiology.  No physical done prior to discharge, pt left without allowin an  Exam. Assessment/Plan: PID, nearing completion of inpt care. AMA discharge  Plan is for discharge meds to be called in to pharmacy of record, and will have RN call pt if possible with infor on antibiotics.  LOS: 1 day    Kristie Williams V 12/04/2011, 8:46 PM

## 2011-12-04 NOTE — Progress Notes (Signed)
Patient was called several time at number that was listed as contact number to inform patient that medication was at pharmacy for pick up if she needed them. Line was busy when myself and the operator called the contact number. No other numbers were listed. Will continue to try and call to get in contact with patient.

## 2011-12-04 NOTE — Progress Notes (Signed)
Dr. Emelda Fear notified that patient requested to leave AMA, stated he will be up to talk to patient, patient notified

## 2011-12-04 NOTE — Progress Notes (Signed)
Patient left AMA, instructed patient to stay until Dr Emelda Fear come to talk with her, she refused stated "I got to go". NSL d/c'd, cath intact, ambulated to door by NT.

## 2011-12-04 NOTE — Progress Notes (Signed)
Subjective: Patient reports nausea and vomiting.  Pain is improved.  Objective: I have reviewed patient'Williams vital signs, intake and output, medications and labs.  General: alert, cooperative and appears stated age GI: abnormal findings:  mild tenderness, no rebound Extremities: extremities normal, atraumatic, no cyanosis or edema   Assessment/Plan: Acute PID--continue IV abx Home once tolerating Po.  LOS: 1 day    Kristie Williams 12/04/2011, 8:18 AM

## 2011-12-04 NOTE — H&P (Signed)
HPI Kristie Williams is a 21 y.o. female who presents to MAU via EMS with abdominal pain. The pain started 2 days ago. She rates the pain as 10/10. The pain is located over the entire abdomen. Associated symptoms include nausea. She denies vaginal bleeding. The history was provided by the patient.  OB History    Grav  Para  Term  Preterm  Abortions  TAB  SAB  Ect  Mult  Living                 History reviewed. No pertinent past medical history.  Past Surgical History   Procedure  Date   .  Knee surgery      right side   History reviewed. No pertinent family history.  History   Substance Use Topics   .  Smoking status:  Current Every Day Smoker -- 0.5 packs/day     Types:  Cigarettes   .  Smokeless tobacco:  Not on file   .  Alcohol Use:  Yes   Allergies:  Allergies   Allergen  Reactions   .  Ibuprofen  Other (See Comments)     migraine   .  Kiwi Extract  Swelling    Prescriptions prior to admission   Medication  Sig  Dispense  Refill   .  aspirin-acetaminophen-caffeine (EXCEDRIN MIGRAINE) 250-250-65 MG per tablet  Take 2 tablets by mouth every 8 (eight) hours as needed.     .  traMADol (ULTRAM) 50 MG tablet  Take 1 tablet (50 mg total) by mouth every 6 (six) hours as needed for pain.  25 tablet  0   .  HYDROcodone-acetaminophen (NORCO/VICODIN) 5-325 MG per tablet  Take 1 tablet by mouth every 4 (four) hours as needed for pain.  12 tablet  0   ROS  Physical Exam   Blood pressure 101/64, pulse 84, temperature 98.4 F (36.9 C), temperature source Oral, resp. rate 20, last menstrual period 11/11/2011, SpO2 100.00%.  Physical Exam  Nursing note and vitals reviewed.  Constitutional: She is oriented to person, place, and time. No distress.  Thin, white female. Appears very uncomfortable. Lying on exam bed in fetal position holding lower abdomen.  HENT:  Head: Normocephalic and atraumatic.  Eyes: EOM are normal.  Neck: Neck supple.  Cardiovascular: Normal rate.  Respiratory: Effort  normal.  GI: Soft. There is no tenderness.  Genitourinary: Vagina normal.  Musculoskeletal: Normal range of motion.  Neurological: She is alert and oriented to person, place, and time.  Skin: Skin is warm and dry.  Psychiatric: She has a normal mood and affect. Her behavior is normal. Judgment and thought content normal.  Procedures  Results for orders placed during the hospital encounter of 12/03/11 (from the past 24 hour(s))   URINALYSIS, ROUTINE W REFLEX MICROSCOPIC Status: Normal    Collection Time    12/03/11 3:00 AM   Component  Value  Range    Color, Urine  YELLOW  YELLOW    APPearance  CLEAR  CLEAR    Specific Gravity, Urine  1.015  1.005 - 1.030    pH  7.0  5.0 - 8.0    Glucose, UA  NEGATIVE  NEGATIVE mg/dL    Hgb urine dipstick  NEGATIVE  NEGATIVE    Bilirubin Urine  NEGATIVE  NEGATIVE    Ketones, ur  NEGATIVE  NEGATIVE mg/dL    Protein, ur  NEGATIVE  NEGATIVE mg/dL    Urobilinogen, UA  0.2  0.0 -  1.0 mg/dL    Nitrite  NEGATIVE  NEGATIVE    Leukocytes, UA  NEGATIVE  NEGATIVE   URINE RAPID DRUG SCREEN (HOSP PERFORMED) Status: Abnormal    Collection Time    12/03/11 3:00 AM   Component  Value  Range    Opiates  NONE DETECTED  NONE DETECTED    Cocaine  NONE DETECTED  NONE DETECTED    Benzodiazepines  NONE DETECTED  NONE DETECTED    Amphetamines  NONE DETECTED  NONE DETECTED    Tetrahydrocannabinol  POSITIVE (*)  NONE DETECTED    Barbiturates  NONE DETECTED  NONE DETECTED   CBC WITH DIFFERENTIAL Status: Abnormal    Collection Time    12/03/11 3:25 AM   Component  Value  Range    WBC  15.5 (*)  4.0 - 10.5 K/uL    RBC  4.46  3.87 - 5.11 MIL/uL    Hemoglobin  13.4  12.0 - 15.0 g/dL    HCT  16.1  09.6 - 04.5 %    MCV  87.2  78.0 - 100.0 fL    MCH  30.0  26.0 - 34.0 pg    MCHC  34.4  30.0 - 36.0 g/dL    RDW  40.9  81.1 - 91.4 %    Platelets  248  150 - 400 K/uL    Neutrophils Relative  85 (*)  43 - 77 %    Neutro Abs  13.2 (*)  1.7 - 7.7 K/uL    Lymphocytes Relative  8  (*)  12 - 46 %    Lymphs Abs  1.2  0.7 - 4.0 K/uL    Monocytes Relative  6  3 - 12 %    Monocytes Absolute  1.0  0.1 - 1.0 K/uL    Eosinophils Relative  1  0 - 5 %    Eosinophils Absolute  0.1  0.0 - 0.7 K/uL    Basophils Relative  0  0 - 1 %    Basophils Absolute  0.0  0.0 - 0.1 K/uL   HCG, QUANTITATIVE, PREGNANCY Status: Normal    Collection Time    12/03/11 3:25 AM   Component  Value  Range    hCG, Beta Chain, Quant, S  <1  <5 mIU/mL   Imaging Ultrasound Shows possible bilateral pyosalpinx   Assessment: 21 y.o. female with pelvic pain  PID   Plan: Admit for IV Abx, pain control and IV hydration.

## 2011-12-05 ENCOUNTER — Telehealth (HOSPITAL_COMMUNITY): Payer: Self-pay | Admitting: Obstetrics and Gynecology

## 2011-12-05 LAB — GC/CHLAMYDIA PROBE AMP, GENITAL: GC Probe Amp, Genital: NEGATIVE

## 2011-12-05 NOTE — Telephone Encounter (Signed)
Patient called to check on her lab results.  Notified patient of positive chlamydia culture.  Instructed patient to complete her doxycycline Rx.  Patient "claimed she was not given any prescriptions".  Told patient they were routed directly to her pharmacy.  Instructed patient to pick up Rx and complete Rx.  Instructed patient to notify her partner for treatment.  Report faxed to health department.

## 2011-12-06 ENCOUNTER — Encounter (HOSPITAL_COMMUNITY): Payer: Self-pay | Admitting: *Deleted

## 2011-12-10 NOTE — MAU Provider Note (Signed)
Medical Screening exam and patient care preformed by advanced practice provider.  Agree with the above management.  

## 2012-01-19 ENCOUNTER — Encounter (HOSPITAL_COMMUNITY): Payer: Self-pay | Admitting: *Deleted

## 2012-01-19 ENCOUNTER — Emergency Department (HOSPITAL_COMMUNITY)
Admission: EM | Admit: 2012-01-19 | Discharge: 2012-01-20 | Disposition: A | Discharge status: Home / Self Care | Payer: Medicaid - Out of State | Attending: Emergency Medicine | Admitting: Emergency Medicine

## 2012-01-19 DIAGNOSIS — R300 Dysuria: Secondary | ICD-10-CM | POA: Insufficient documentation

## 2012-01-19 DIAGNOSIS — F17200 Nicotine dependence, unspecified, uncomplicated: Secondary | ICD-10-CM | POA: Insufficient documentation

## 2012-01-19 DIAGNOSIS — F1210 Cannabis abuse, uncomplicated: Secondary | ICD-10-CM | POA: Insufficient documentation

## 2012-01-19 DIAGNOSIS — R350 Frequency of micturition: Secondary | ICD-10-CM | POA: Insufficient documentation

## 2012-01-19 DIAGNOSIS — Z3490 Encounter for supervision of normal pregnancy, unspecified, unspecified trimester: Secondary | ICD-10-CM

## 2012-01-19 DIAGNOSIS — N390 Urinary tract infection, site not specified: Secondary | ICD-10-CM

## 2012-01-19 DIAGNOSIS — Z331 Pregnant state, incidental: Secondary | ICD-10-CM | POA: Insufficient documentation

## 2012-01-19 DIAGNOSIS — Z8742 Personal history of other diseases of the female genital tract: Secondary | ICD-10-CM | POA: Insufficient documentation

## 2012-01-19 HISTORY — DX: Acute parametritis and pelvic cellulitis: N73.0

## 2012-01-19 LAB — CBC WITH DIFFERENTIAL/PLATELET
Basophils Relative: 0 % (ref 0–1)
Eosinophils Relative: 0 % (ref 0–5)
HCT: 38.1 % (ref 36.0–46.0)
Lymphs Abs: 1.2 10*3/uL (ref 0.7–4.0)
MCH: 30.1 pg (ref 26.0–34.0)
MCV: 88.2 fL (ref 78.0–100.0)
Monocytes Absolute: 2.1 10*3/uL — ABNORMAL HIGH (ref 0.1–1.0)
Monocytes Relative: 10 % (ref 3–12)
Neutro Abs: 17.3 10*3/uL — ABNORMAL HIGH (ref 1.7–7.7)
Platelets: 199 10*3/uL (ref 150–400)
RBC: 4.32 MIL/uL (ref 3.87–5.11)

## 2012-01-19 LAB — POCT I-STAT, CHEM 8
Chloride: 104 mEq/L (ref 96–112)
Creatinine, Ser: 0.7 mg/dL (ref 0.50–1.10)
Glucose, Bld: 87 mg/dL (ref 70–99)
Hemoglobin: 13.9 g/dL (ref 12.0–15.0)
Potassium: 3.5 mEq/L (ref 3.5–5.1)
Sodium: 139 mEq/L (ref 135–145)

## 2012-01-19 LAB — URINALYSIS, ROUTINE W REFLEX MICROSCOPIC
Bilirubin Urine: NEGATIVE
Glucose, UA: NEGATIVE mg/dL
Ketones, ur: NEGATIVE mg/dL
Protein, ur: 100 mg/dL — AB
pH: 6 (ref 5.0–8.0)

## 2012-01-19 LAB — WET PREP, GENITAL: Trich, Wet Prep: NONE SEEN

## 2012-01-19 LAB — URINE MICROSCOPIC-ADD ON

## 2012-01-19 LAB — HCG, QUANTITATIVE, PREGNANCY: hCG, Beta Chain, Quant, S: 58 m[IU]/mL — ABNORMAL HIGH (ref <5)

## 2012-01-19 MED ORDER — SODIUM CHLORIDE 0.9 % IV BOLUS (SEPSIS)
1000.0000 mL | Freq: Once | INTRAVENOUS | Status: AC
  Administered 2012-01-19: 1000 mL via INTRAVENOUS

## 2012-01-19 MED ORDER — DEXTROSE 5 % IV SOLN
1.0000 g | Freq: Once | INTRAVENOUS | Status: AC
  Administered 2012-01-19: 1 g via INTRAVENOUS
  Filled 2012-01-19: qty 10

## 2012-01-19 NOTE — ED Provider Notes (Signed)
History     CSN: [id number]  Arrival date & time 01/19/12  2008   First MD Initiated Contact with Patient 01/19/12 2154      Chief Complaint  Patient presents with  . Abdominal Pain    (Consider location/radiation/quality/duration/timing/severity/associated sxs/prior treatment) HPI Pt with 2 days of lower abd pain, urinary frequency, dysuria. No fever or chills. No N/V/D. LMP middle of last month. Hosp last month for PID. Denies vag d/c, bleeding.  Past Medical History  Diagnosis Date  . PID (acute pelvic inflammatory disease)     Past Surgical History  Procedure Date  . Knee surgery     right side    No family history on file.  History  Substance Use Topics  . Smoking status: Current Every Day Smoker -- 0.5 packs/day    Types: Cigarettes  . Smokeless tobacco: Not on file  . Alcohol Use: Yes    OB History    Grav Para Term Preterm Abortions TAB SAB Ect Mult Living                  Review of Systems  Constitutional: Negative for fever and chills.  Gastrointestinal: Positive for abdominal pain. Negative for nausea, vomiting, diarrhea and constipation.  Genitourinary: Positive for dysuria and frequency. Negative for hematuria, flank pain, vaginal bleeding, vaginal discharge, enuresis, difficulty urinating, vaginal pain and pelvic pain.  Neurological: Negative for dizziness, weakness and numbness.  All other systems reviewed and are negative.    Allergies  Ibuprofen and Kiwi extract  Home Medications   Current Outpatient Rx  Name  Route  Sig  Dispense  Refill  . HYDROCODONE-ACETAMINOPHEN PO   Oral   Take 1 tablet by mouth daily as needed.         . CEPHALEXIN 500 MG PO CAPS   Oral   Take 1 capsule (500 mg total) by mouth 2 (two) times daily.   14 capsule   0   . PRENATAL COMPLETE 14-0.4 MG PO TABS   Oral   Take 1 tablet by mouth daily.   60 each   0     BP 95/64  Pulse 79  Temp 98.7 F (37.1 C) (Oral)  Resp 16  SpO2 97%  LMP  12/22/2011  Physical Exam  Nursing note and vitals reviewed. Constitutional: She is oriented to person, place, and time. She appears well-developed and well-nourished. No distress.  HENT:  Head: Normocephalic and atraumatic.  Mouth/Throat: Oropharynx is clear and moist.  Eyes: EOM are normal. Pupils are equal, round, and reactive to light.  Neck: Normal range of motion. Neck supple.  Cardiovascular: Normal rate and regular rhythm.   Pulmonary/Chest: Effort normal and breath sounds normal. No respiratory distress. She has no wheezes. She has no rales.  Abdominal: Soft. Bowel sounds are normal. She exhibits no distension and no mass. There is no tenderness. There is no rebound and no guarding.  Genitourinary: Vaginal discharge (white vag d/c) found.       Os closed. No cervical motion tenderness, No uterine or adnexal tenderness  Musculoskeletal: Normal range of motion. She exhibits tenderness (Mild Left flank tenderness). She exhibits no edema.  Neurological: She is alert and oriented to person, place, and time.  Skin: Skin is warm and dry. No rash noted. No erythema.  Psychiatric: She has a normal mood and affect. Her behavior is normal.    ED Course  Procedures (including critical care time)  Labs Reviewed  CBC WITH DIFFERENTIAL - Abnormal; Notable  for the following:    WBC 20.6 (*)     Neutrophils Relative 84 (*)     Lymphocytes Relative 6 (*)     Neutro Abs 17.3 (*)     Monocytes Absolute 2.1 (*)     All other components within normal limits  URINALYSIS, ROUTINE W REFLEX MICROSCOPIC - Abnormal; Notable for the following:    APPearance TURBID (*)     Hgb urine dipstick LARGE (*)     Protein, ur 100 (*)     Nitrite POSITIVE (*)     Leukocytes, UA LARGE (*)     All other components within normal limits  POCT PREGNANCY, URINE - Abnormal; Notable for the following:    Preg Test, Ur POSITIVE (*)     All other components within normal limits  URINE MICROSCOPIC-ADD ON - Abnormal;  Notable for the following:    Bacteria, UA MANY (*)     All other components within normal limits  WET PREP, GENITAL - Abnormal; Notable for the following:    Yeast Wet Prep HPF POC FEW (*)     Clue Cells Wet Prep HPF POC FEW (*)     WBC, Wet Prep HPF POC MODERATE (*)     All other components within normal limits  HCG, QUANTITATIVE, PREGNANCY - Abnormal; Notable for the following:    hCG, Beta Chain, Quant, S 58 (*)     All other components within normal limits  URINE CULTURE  POCT I-STAT, CHEM 8  GC/CHLAMYDIA PROBE AMP   US Ob Comp Less 14 Wks  01/20/2012  *RADIOLOGY REPORT*  Clinical Data: Lower abdominal pain.  OBSTETRIC <14 WK [place] AND TRANSVAGINAL OB US  Technique:  Both transabdominal and transvaginal ultrasound examinations were performed for complete evaluation of the gestation as well as the maternal uterus, adnexal regions, and pelvic cul-de-sac.  Transvaginal technique was performed to assess early pregnancy.  Comparison:  Pelvic ultrasound performed 12/03/2011  Intrauterine gestational sac:  None seen. Yolk sac: N/A Embryo: N/A  Maternal uterus/adnexae: The uterus is unremarkable in appearance.  The endometrial echo complex measures 0.7 cm in thickness, within normal limits.  The ovaries are within normal limits.  The right ovary measures 3.3 x 2.5 x 2.0 cm, while the left ovary measures 3.1 x 2.0 x 1.5 cm. A likely corpus luteal cyst is noted at the right ovary, measuring 1.8 cm.  Limited Doppler evaluation demonstrates grossly normal color Doppler blood flow with respect to both ovaries.  A small amount of free fluid is seen within the pelvic cul-de-sac, likely physiologic in nature.  IMPRESSION: No intrauterine gestational sac yet seen.  This remains within normal limits, given the quantitative beta HCG level of 58.  No ectopic pregnancy identified at this time.  Would perform follow-up pelvic ultrasound in 2 weeks, for further evaluation.   Original Report Authenticated By: Tonia Ghent, M.D.    US Ob Transvaginal  01/20/2012  *RADIOLOGY REPORT*  Clinical Data: Lower abdominal pain.  OBSTETRIC <14 WK [place] AND TRANSVAGINAL OB US  Technique:  Both transabdominal and transvaginal ultrasound examinations were performed for complete evaluation of the gestation as well as the maternal uterus, adnexal regions, and pelvic cul-de-sac.  Transvaginal technique was performed to assess early pregnancy.  Comparison:  Pelvic ultrasound performed 12/03/2011  Intrauterine gestational sac:  None seen. Yolk sac: N/A Embryo: N/A  Maternal uterus/adnexae: The uterus is unremarkable in appearance.  The endometrial echo complex measures 0.7 cm in thickness, within normal limits.  The ovaries are within normal limits.  The right ovary measures 3.3 x 2.5 x 2.0 cm, while the left ovary measures 3.1 x 2.0 x 1.5 cm. A likely corpus luteal cyst is noted at the right ovary, measuring 1.8 cm.  Limited Doppler evaluation demonstrates grossly normal color Doppler blood flow with respect to both ovaries.  A small amount of free fluid is seen within the pelvic cul-de-sac, likely physiologic in nature.  IMPRESSION: No intrauterine gestational sac yet seen.  This remains within normal limits, given the quantitative beta HCG level of 58.  No ectopic pregnancy identified at this time.  Would perform follow-up pelvic ultrasound in 2 weeks, for further evaluation.   Original Report Authenticated By: Tonia Ghent, M.D.      1. Complicated UTI (urinary tract infection)   2. Pregnant       MDM          Loren Racer, MD 01/21/12 [id number]

## 2012-01-19 NOTE — ED Notes (Addendum)
C/o LLQ & pelvic abd pain, onset friday, (denies: nvd fever d/c or bleeding), recent STD, seen in November at [facility] for the same, finished abx, "believe I have a UTI also". Reports some recent dizziness. Took vicodin PTA. "not pregnant".

## 2012-01-19 NOTE — ED Notes (Signed)
Pelvic completed.

## 2012-01-19 NOTE — ED Provider Notes (Addendum)
Rec. Sign-over at 2330.  Pt presented for lower abdominal pressure and dysuria.  She has a noted leukocytosis, UTI, and positive pregnancy test.  The quant Hcg is less than 100.  Ultrasound is pending.  Do not anticipate seeing an IUP however she was also recently treated for PID.  Will review results, reassess, and provide the appropriate disposition.  Tobin Chad, MD 01/19/12 4540  [id number].  Pt stable, NAD.  U/S dos not demonstrate an IUP.  No pathology was noted.  Given the low quant Hcg, did not expect to see an IUP.  Pt will be treated for the UTI and has been encouraged to follow-up with Gyn or return to the emergency department in 3-5 days for a repert quant Hcg.  If her pain worsens, she should return to the ER immediately.  Tobin Chad, MD 01/20/12 [phone]

## 2012-01-20 ENCOUNTER — Emergency Department (HOSPITAL_COMMUNITY): Payer: Medicaid - Out of State

## 2012-01-20 LAB — GC/CHLAMYDIA PROBE AMP
CT Probe RNA: NEGATIVE
GC Probe RNA: NEGATIVE

## 2012-01-20 MED ORDER — CEPHALEXIN 500 MG PO CAPS: 500.0000 mg | ORAL_CAPSULE | Freq: Two times a day (BID) | ORAL | Status: DC

## 2012-01-20 MED ORDER — PRENATAL COMPLETE 14-0.4 MG PO TABS: 1.0000 | ORAL_TABLET | Freq: Every day | ORAL | Status: DC

## 2012-01-20 NOTE — ED Notes (Signed)
Critical value: Troponin of 0.47  Dr Lorenso Courier notified at this time

## 2012-01-20 NOTE — ED Notes (Signed)
Patient transported to Ultrasound 

## 2012-01-21 LAB — URINE CULTURE

## 2012-01-22 NOTE — ED Notes (Signed)
+   Urine Patient treated with Keflex-sensitive to same-chart appended per protocol MD. 

## 2012-02-17 ENCOUNTER — Inpatient Hospital Stay (HOSPITAL_COMMUNITY)
Admission: AD | Admit: 2012-02-17 | Discharge: 2012-02-17 | Disposition: A | Discharge status: Hospice | Payer: Self-pay | Source: Ambulatory Visit | Attending: Obstetrics & Gynecology | Admitting: Obstetrics & Gynecology

## 2012-02-17 ENCOUNTER — Encounter (HOSPITAL_COMMUNITY): Payer: Self-pay | Admitting: *Deleted

## 2012-02-17 DIAGNOSIS — O219 Vomiting of pregnancy, unspecified: Secondary | ICD-10-CM

## 2012-02-17 DIAGNOSIS — O210 Mild hyperemesis gravidarum: Secondary | ICD-10-CM | POA: Insufficient documentation

## 2012-02-17 DIAGNOSIS — Z3201 Encounter for pregnancy test, result positive: Secondary | ICD-10-CM

## 2012-02-17 HISTORY — DX: Mild hyperemesis gravidarum: O21.0

## 2012-02-17 HISTORY — DX: Unspecified abnormal cytological findings in specimens from cervix uteri: R87.619

## 2012-02-17 HISTORY — DX: Reserved for concepts with insufficient information to code with codable children: IMO0002

## 2012-02-17 LAB — CBC WITH DIFFERENTIAL/PLATELET
Basophils Absolute: 0 10*3/uL (ref 0.0–0.1)
Basophils Relative: 0 % (ref 0–1)
Eosinophils Absolute: 0 10*3/uL (ref 0.0–0.7)
HCT: 37.2 % (ref 36.0–46.0)
Hemoglobin: 12.7 g/dL (ref 12.0–15.0)
Lymphocytes Relative: 24 % (ref 12–46)
Lymphs Abs: 3 10*3/uL (ref 0.7–4.0)
MCHC: 34.1 g/dL (ref 30.0–36.0)
MCV: 85.7 fL (ref 78.0–100.0)
Neutro Abs: 8.3 10*3/uL — ABNORMAL HIGH (ref 1.7–7.7)
RDW: 13.7 % (ref 11.5–15.5)

## 2012-02-17 LAB — URINALYSIS, ROUTINE W REFLEX MICROSCOPIC
Glucose, UA: 500 mg/dL — AB
Hgb urine dipstick: NEGATIVE
Ketones, ur: 15 mg/dL — AB
Leukocytes, UA: NEGATIVE
Protein, ur: NEGATIVE mg/dL
pH: 6 (ref 5.0–8.0)

## 2012-02-17 MED ORDER — PROMETHAZINE HCL 25 MG RE SUPP: 25.0000 mg | Freq: Four times a day (QID) | RECTAL | Status: DC | PRN

## 2012-02-17 MED ORDER — ONDANSETRON HCL 4 MG PO TABS: 4.0000 mg | ORAL_TABLET | Freq: Every day | ORAL | Status: AC | PRN

## 2012-02-17 MED ORDER — LACTATED RINGERS IV BOLUS (SEPSIS)
1000.0000 mL | Freq: Once | INTRAVENOUS | Status: AC
  Administered 2012-02-17: 1000 mL via INTRAVENOUS

## 2012-02-17 MED ORDER — PROMETHAZINE HCL 25 MG PO TABS: 12.5000 mg | ORAL_TABLET | Freq: Four times a day (QID) | ORAL | Status: DC | PRN

## 2012-02-17 MED ORDER — PROMETHAZINE HCL 25 MG/ML IJ SOLN
25.0000 mg | INTRAMUSCULAR | Status: AC
  Administered 2012-02-17: 25 mg via INTRAVENOUS
  Filled 2012-02-17: qty 1

## 2012-02-17 NOTE — MAU Note (Signed)
Not in lobby

## 2012-02-17 NOTE — MAU Note (Signed)
Past few days can't keep anything down. Ongoing problem. Has not been seen anywhere yet.

## 2012-02-17 NOTE — MAU Note (Signed)
In lab when called

## 2012-02-17 NOTE — MAU Provider Note (Signed)
Attestation of Attending Supervision of Advanced Practitioner (CNM/NP): Evaluation and management procedures were performed by the Advanced Practitioner under my supervision and collaboration.  I have reviewed the Advanced Practitioner's note and chart, and I agree with the management and plan.  HARRAWAY-SMITH, Lovena Kluck 9:42 PM     

## 2012-02-17 NOTE — MAU Note (Signed)
Vomiting for one week and unable to keep anything down.

## 2012-02-17 NOTE — MAU Provider Note (Signed)
Chief Complaint: Emesis   First Provider Initiated Contact with Patient 02/17/12 2006     SUBJECTIVE HPI: Kristie Williams is a 22 y.o. G6P0040 at [redacted]w[redacted]d by LMP who presents to maternity admissions reporting vomiting several times/day and 10 lb weight loss in last couple of weeks.  She denies abdominal pain, LOF, vaginal bleeding, vaginal itching/burning, urinary symptoms, h/a, dizziness, or fever/chills.     Past Medical History  Diagnosis Date  . PID (acute pelvic inflammatory disease)   . Hyperemesis arising during pregnancy   . Abnormal Pap smear    Past Surgical History  Procedure Date  . Knee surgery     right side  . Colposcopy vulva w/ biopsy   . Wisdom tooth extraction    History   Social History  . Marital Status: Single    Spouse Name: N/A    Number of Children: N/A  . Years of Education: N/A   Occupational History  . Not on file.   Social History Main Topics  . Smoking status: Current Every Day Smoker -- 0.5 packs/day    Types: Cigarettes  . Smokeless tobacco: Not on file  . Alcohol Use: No  . Drug Use: Yes    Special: Marijuana     Comment: yesterday  . Sexually Active: Yes   Other Topics Concern  . Not on file   Social History Narrative  . No narrative on file   No current facility-administered medications on file prior to encounter.   Current Outpatient Prescriptions on File Prior to Encounter  Medication Sig Dispense Refill  . Prenatal Vit-Fe Fumarate-FA (PRENATAL COMPLETE) 14-0.4 MG TABS Take 1 tablet by mouth daily.  60 each  0   Allergies  Allergen Reactions  . Ibuprofen Other (See Comments)    migraine   . Kiwi Extract Swelling    ROS: Pertinent items in HPI  OBJECTIVE Blood pressure 118/78, pulse 115, temperature 98.4 F (36.9 C), temperature source Oral, resp. rate 18, height 4' 9.5" (1.461 m), weight 39.826 kg (87 lb 12.8 oz), last menstrual period 12/22/2011. GENERAL: Well-developed, well-nourished female in no acute distress.    HEENT: Normocephalic HEART: normal rate RESP: normal effort ABDOMEN: Soft, non-tender EXTREMITIES: Nontender, no edema NEURO: Alert and oriented SPECULUM EXAM: Deferred  LAB RESULTS Results for orders placed during the hospital encounter of 02/17/12 (from the past 24 hour(s))  CBC WITH DIFFERENTIAL     Status: Abnormal   Collection Time   02/17/12  5:55 PM      Component Value Range   WBC 12.3 (*) 4.0 - 10.5 K/uL   RBC 4.34  3.87 - 5.11 MIL/uL   Hemoglobin 12.7  12.0 - 15.0 g/dL   HCT 40.3  47.4 - 25.9 %   MCV 85.7  78.0 - 100.0 fL   MCH 29.3  26.0 - 34.0 pg   MCHC 34.1  30.0 - 36.0 g/dL   RDW 56.3  87.5 - 64.3 %   Platelets 232  150 - 400 K/uL   Neutrophils Relative 68  43 - 77 %   Lymphocytes Relative 24  12 - 46 %   Monocytes Relative 8  3 - 12 %   Eosinophils Relative 0  0 - 5 %   Basophils Relative 0  0 - 1 %   Neutro Abs 8.3 (*) 1.7 - 7.7 K/uL   Lymphs Abs 3.0  0.7 - 4.0 K/uL   Monocytes Absolute 1.0  0.1 - 1.0 K/uL   Eosinophils Absolute 0.0  0.0 - 0.7 K/uL   Basophils Absolute 0.0  0.0 - 0.1 K/uL  HCG, QUANTITATIVE, PREGNANCY     Status: Abnormal   Collection Time   02/17/12  5:55 PM      Component Value Range   hCG, Beta Francene Finders 161096 (*) <5 mIU/mL  ABO/RH     Status: Normal (Preliminary result)   Collection Time   02/17/12  5:55 PM      Component Value Range   ABO/RH(D) A POS    URINALYSIS, ROUTINE W REFLEX MICROSCOPIC     Status: Abnormal   Collection Time   02/17/12  6:18 PM      Component Value Range   Color, Urine YELLOW  YELLOW   APPearance CLEAR  CLEAR   Specific Gravity, Urine >1.030 (*) 1.005 - 1.030   pH 6.0  5.0 - 8.0   Glucose, UA 500 (*) NEGATIVE mg/dL   Hgb urine dipstick NEGATIVE  NEGATIVE   Bilirubin Urine NEGATIVE  NEGATIVE   Ketones, ur 15 (*) NEGATIVE mg/dL   Protein, ur NEGATIVE  NEGATIVE mg/dL   Urobilinogen, UA 0.2  0.0 - 1.0 mg/dL   Nitrite NEGATIVE  NEGATIVE   Leukocytes, UA NEGATIVE  NEGATIVE     ASSESSMENT 1.  Positive pregnancy test   2. Nausea/vomiting in pregnancy     PLAN LR 1000 ml plus Phenergan 25 mg IV x1 dose in MAU Discharge home Phenergan 12.5 mg PO Q6 hours Zofran 4 mg PO Q 8 hours Phenergan 25 mg suppositories if unable to tolerate PO F/U with OB clinic--message sent Outpatient U/S for viability this week Return to MAU as needed    Medication List     As of 02/17/2012  8:25 PM    ASK your doctor about these medications         Prenatal Complete 14-0.4 MG Tabs   Take 1 tablet by mouth daily.         Sharen Counter Certified Nurse-Midwife 02/17/2012  8:25 PM

## 2012-04-22 ENCOUNTER — Encounter: Payer: Self-pay | Admitting: Advanced Practice Midwife

## 2012-12-22 ENCOUNTER — Encounter (HOSPITAL_COMMUNITY): Payer: Self-pay | Admitting: *Deleted

## 2013-10-23 IMAGING — CR DG ABDOMEN ACUTE W/ 1V CHEST
3 series · 3 of 3 positions shown · non-contrast
Comparison: None.

CLINICAL DATA: Left lower rib pain.  Infected piercing in the left
lower back.  No injury.

ACUTE ABDOMEN SERIES (ABDOMEN 2 VIEW & CHEST 1 VIEW)

[w chest pa]
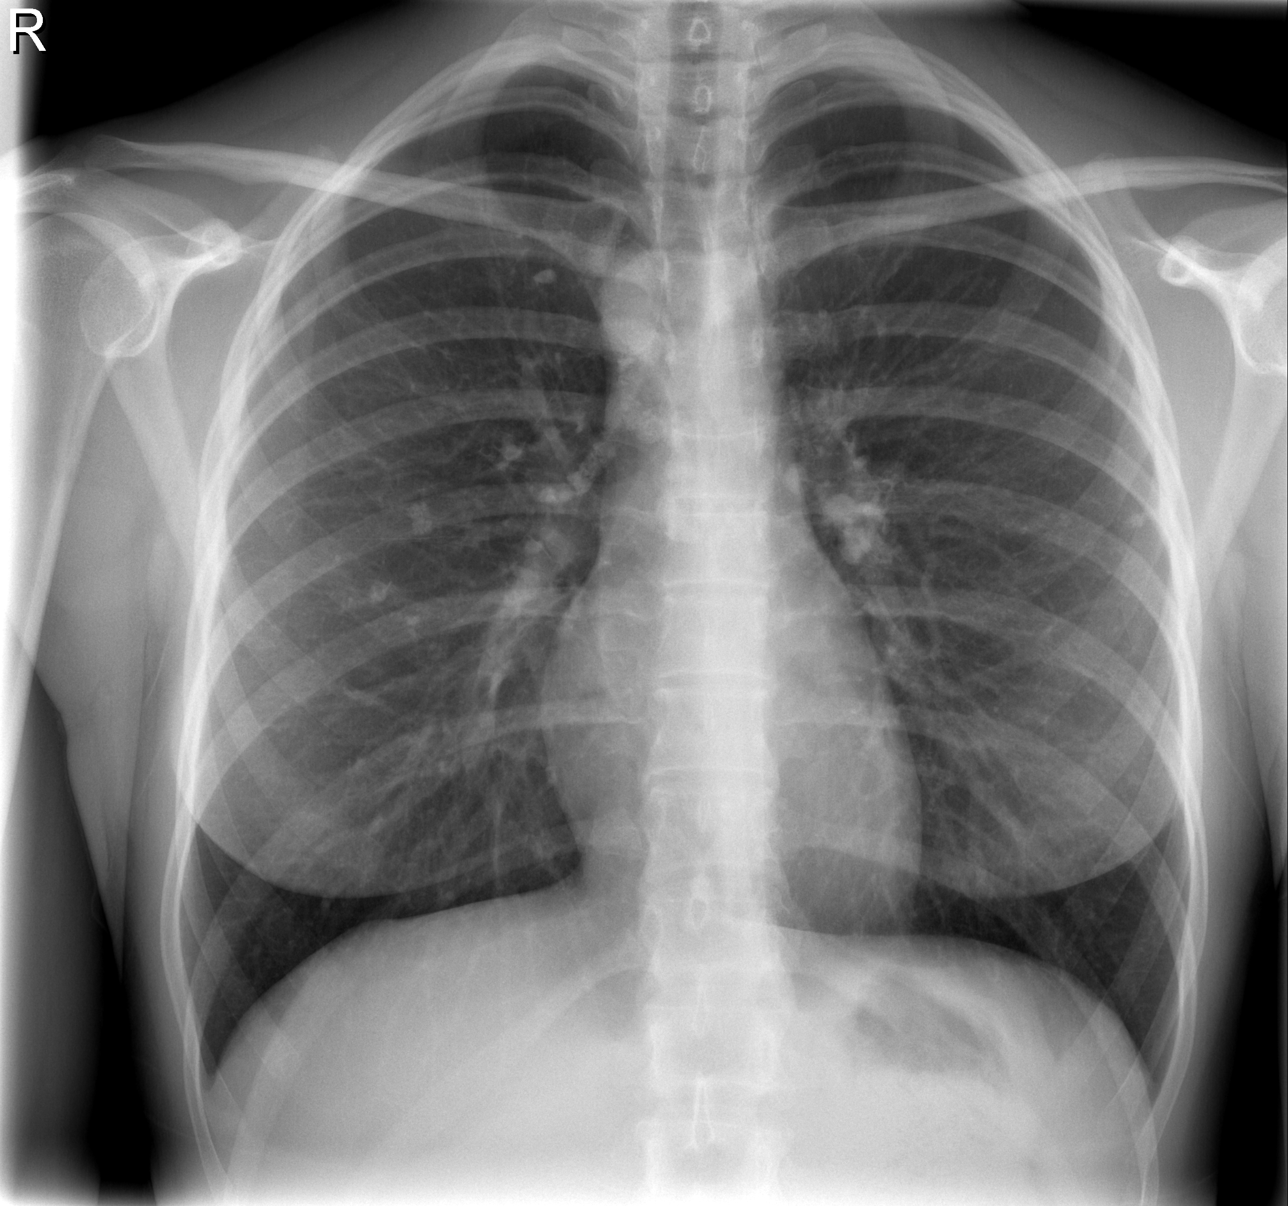

[w abdomen upright]
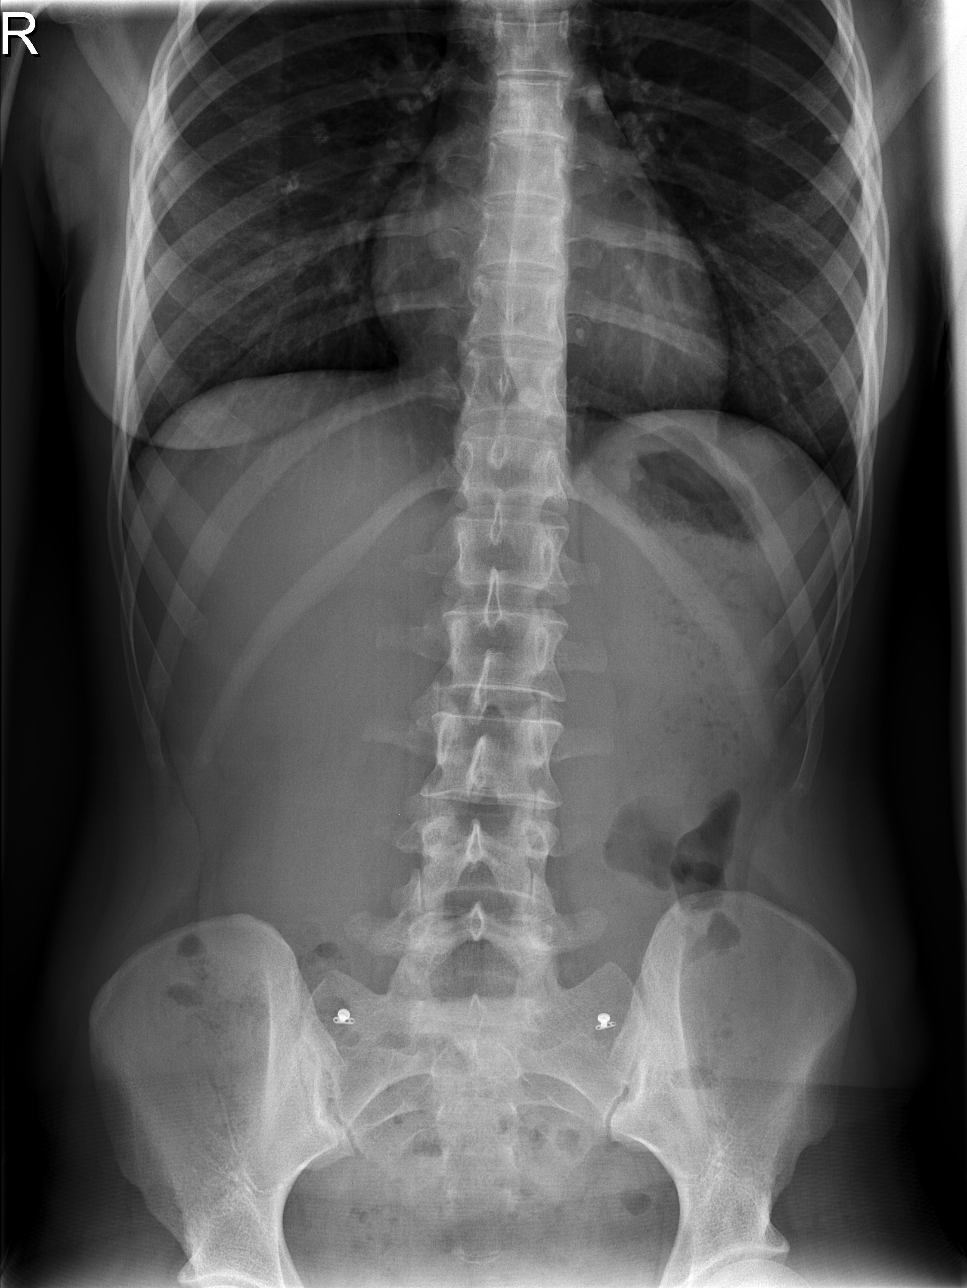

[t abdomen supine]
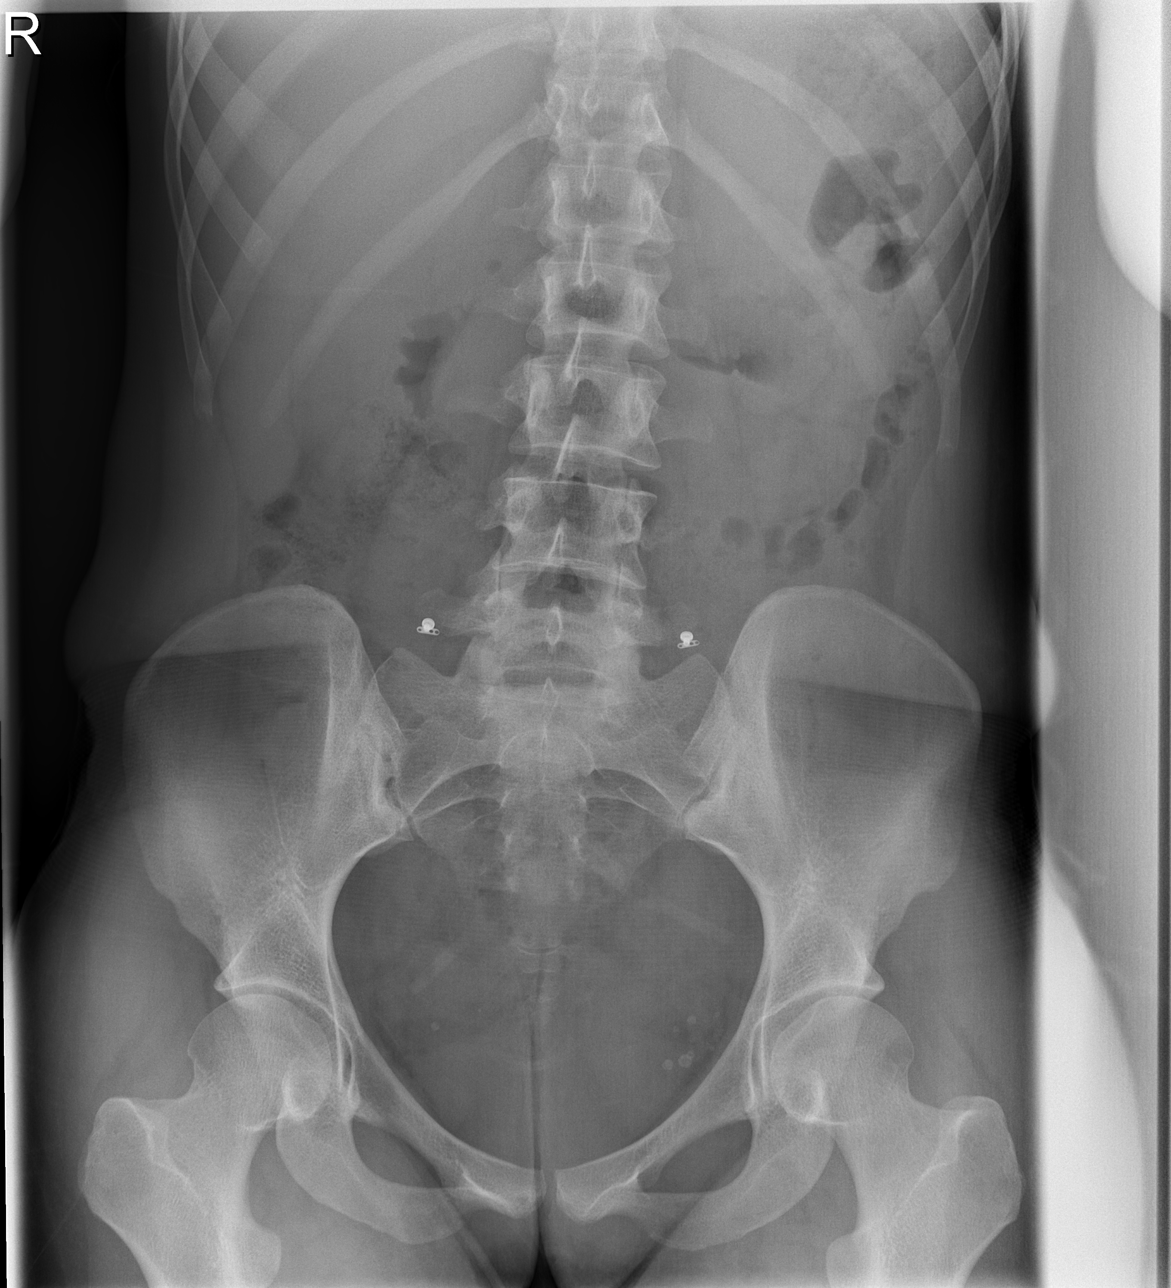

[3 of 3 positions shown; findings below may reference images not displayed]

FINDINGS: Normal heart size and pulmonary vascularity.  Scattered
calcified granulomas in the lungs with calcified hilar and right
paratracheal lymph nodes.  No focal airspace consolidation in the
lungs.  No blunting of costophrenic angles.  No pneumothorax.

Normal bowel gas pattern with scattered gas and stool in the colon.
No small or large bowel distension.  No radiopaque stones.
Metallic foreign bodies projected over the sacral ala bilaterally.
Calcifications in the pelvis consistent with phleboliths.  No free
air in the abdomen.  No abnormal air fluid levels.
IMPRESSION: Calcified granulomas in the lungs and hilar/mediastinal lymph
nodes.  No evidence of active pulmonary disease.  Nonobstructive
bowel gas pattern.

## 2013-11-29 ENCOUNTER — Encounter (HOSPITAL_COMMUNITY): Payer: Self-pay | Admitting: *Deleted

## 2013-12-18 IMAGING — US US PELVIS COMPLETE
1 series · 13 of 25 positions shown · non-contrast
Comparison: None

CLINICAL DATA: Pelvic pain.

TRANSABDOMINAL AND TRANSVAGINAL ULTRASOUND OF PELVIS
TECHNIQUE: Both transabdominal and transvaginal ultrasound
examinations of the pelvis were performed. Transabdominal technique
was performed for global imaging of the pelvis including uterus,
ovaries, adnexal regions, and pelvic cul-de-sac.
It was necessary to proceed with endovaginal exam following the
transabdominal exam to visualize the uterus and ovaries in greater
detail.

[Series 1: us pelvis complete · 59 acquisitions, 13 frames shown]
[im 1/59]
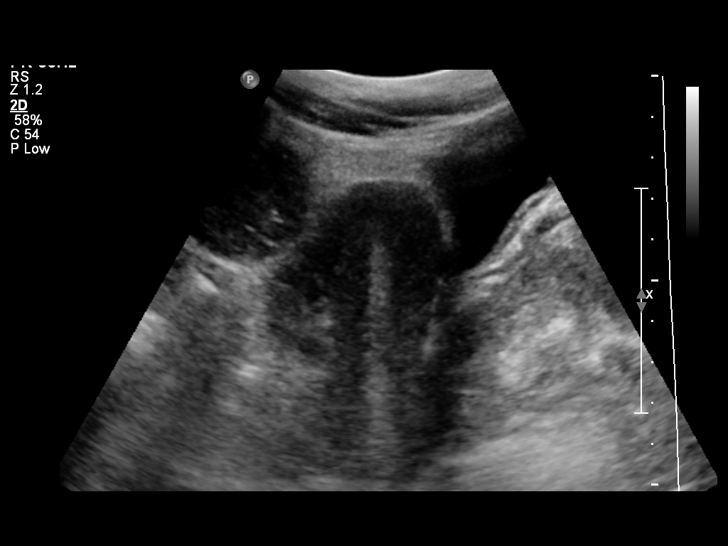
[im 5/59]
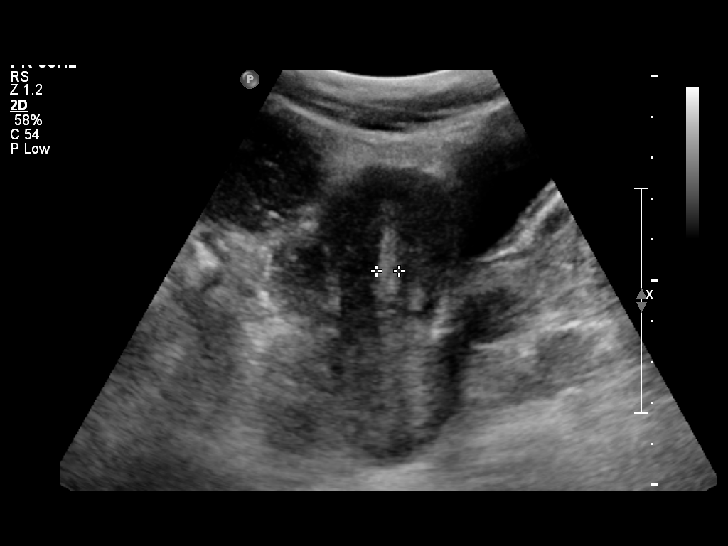
[im 10/59]
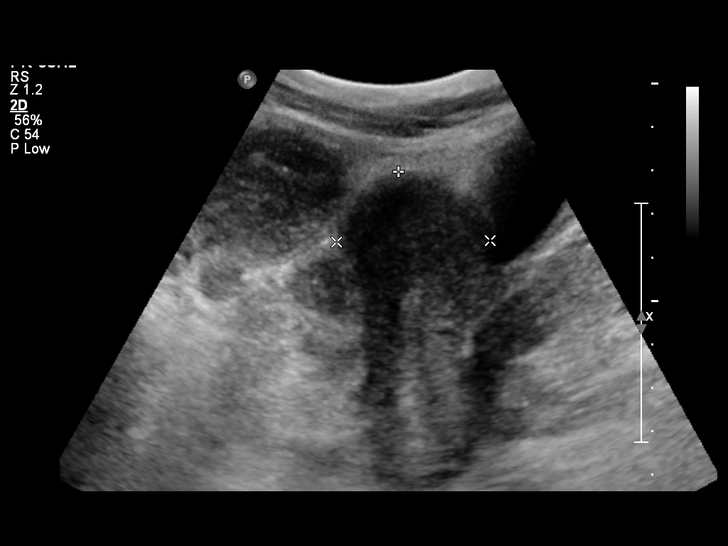
[im 15/59]
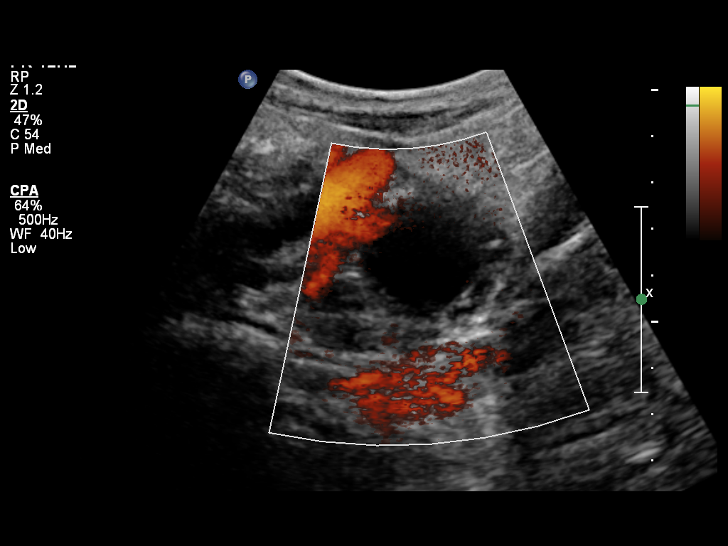
[im 20/59]
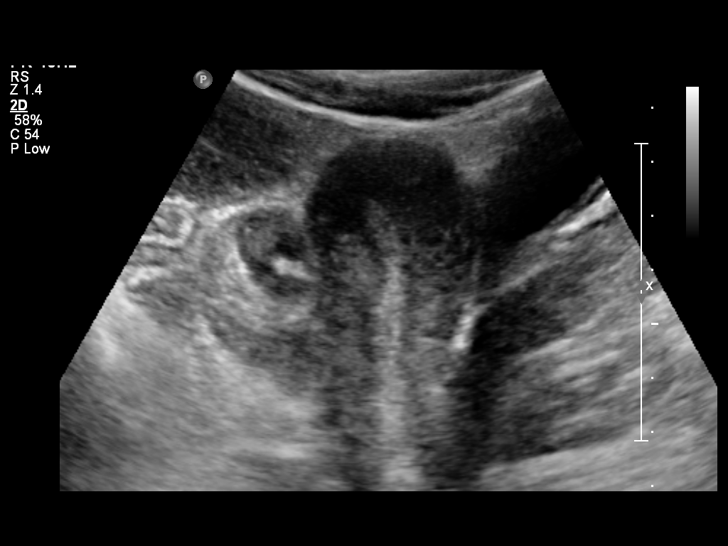
[im 25/59]
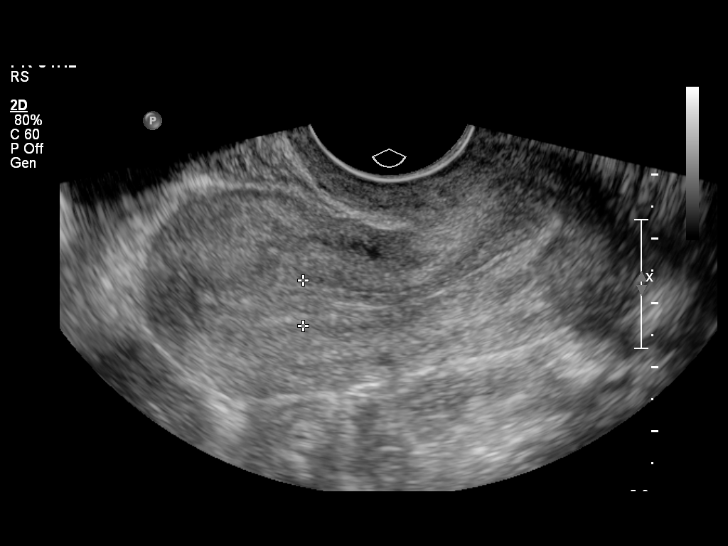
[im 30/59]
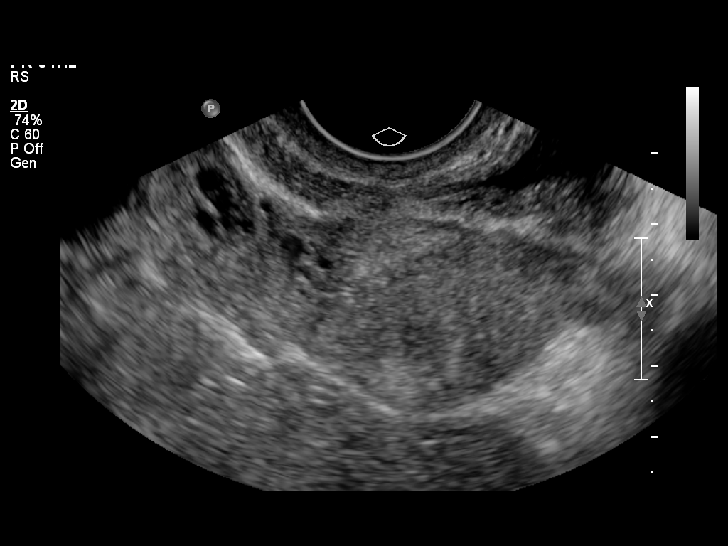
[im 34/59]
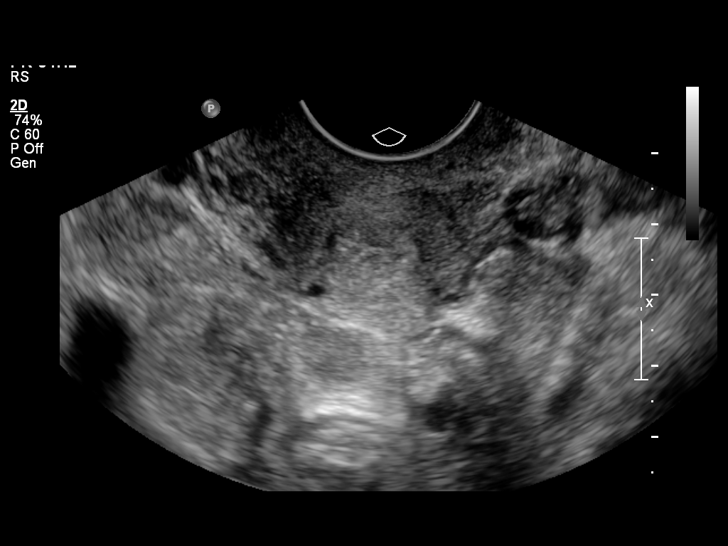
[im 39/59]
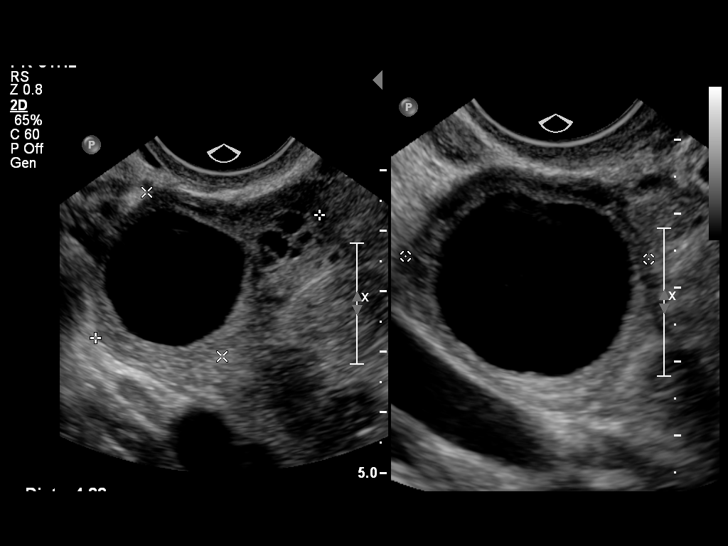
[im 44/59]
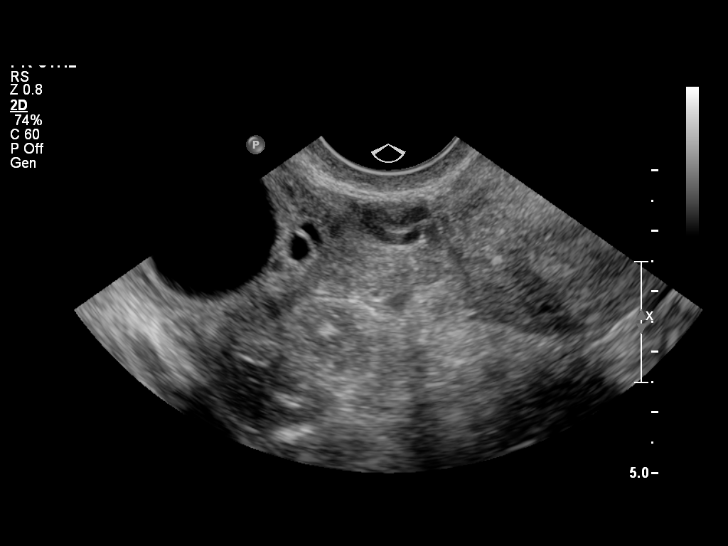
[im 49/59]
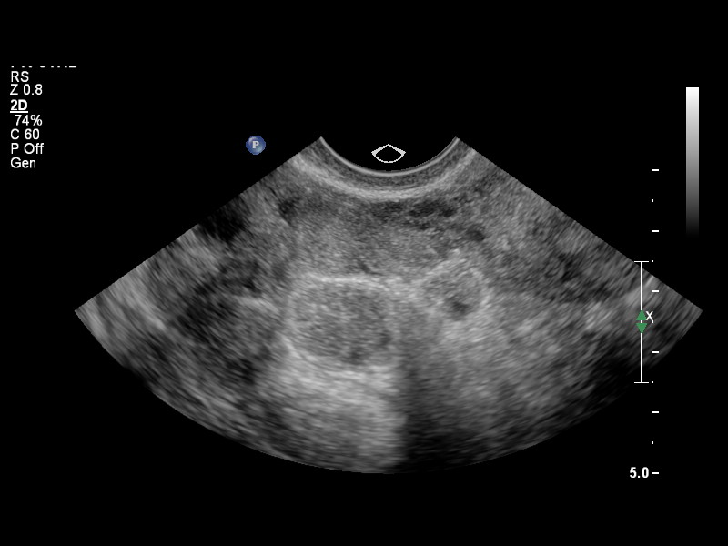
[im 54/59]
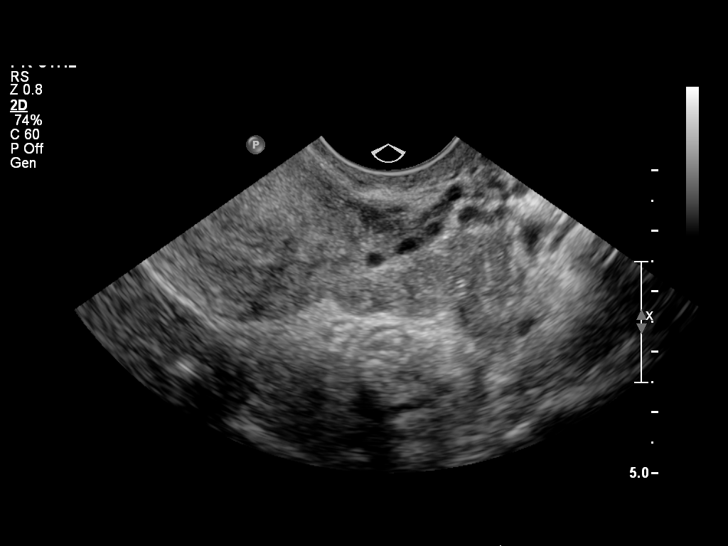
[im 59/59]
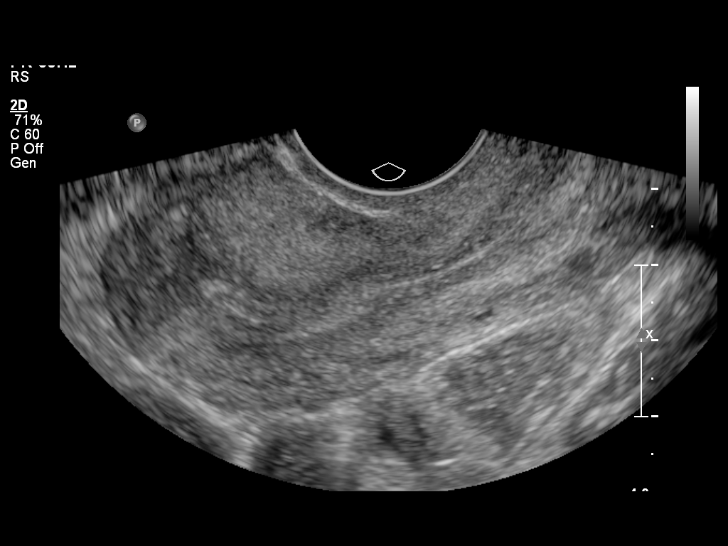

[13 of 25 positions shown; findings below may reference images not displayed]

FINDINGS: Uterus: Normal in size and appearance; measures 7.4 x 3.4 x 4.5 cm.

Endometrium: Slightly heterogeneous, though still normal in
thickness; measures 0.7 cm in thickness.

Right ovary:  Normal appearance/no adnexal mass; measures 4.2 x
x 3.3 cm.

Left ovary: Normal appearance/no adnexal mass; measures 3.2 x 2.0 x
2.1 cm.

Other findings: A small amount of free fluid is noted within the
pelvic cul-de-sac.

There is increased echogenicity within the expected locations of
the Fallopian tubes bilaterally; this may reflect debris, or
possibly pyosalpinx, though no significant distension is
identified.
IMPRESSION: 1.  Uterus grossly unremarkable in appearance.
2.  The ovaries are within normal limits.
3.  Increased echogenicity within the expected locations of the
fallopian tubes bilaterally; this may reflect debris, or possibly
pyosalpinx, though no significant distension is seen.  Would
correlate with the patient's symptoms.

## 2014-02-04 IMAGING — US US OB TRANSVAGINAL
1 series · 13 of 28 positions shown · non-contrast
Comparison: Pelvic ultrasound performed 12/03/2011

CLINICAL DATA: Lower abdominal pain.

OBSTETRIC <14 WK US AND TRANSVAGINAL OB US
TECHNIQUE: Both transabdominal and transvaginal ultrasound
examinations were performed for complete evaluation of the
gestation as well as the maternal uterus, adnexal regions, and
pelvic cul-de-sac.  Transvaginal technique was performed to assess
early pregnancy.

[Series 1: us ob transvaginal · 0.16mm/px · 13 of 65 slices shown]
[im 3/65]
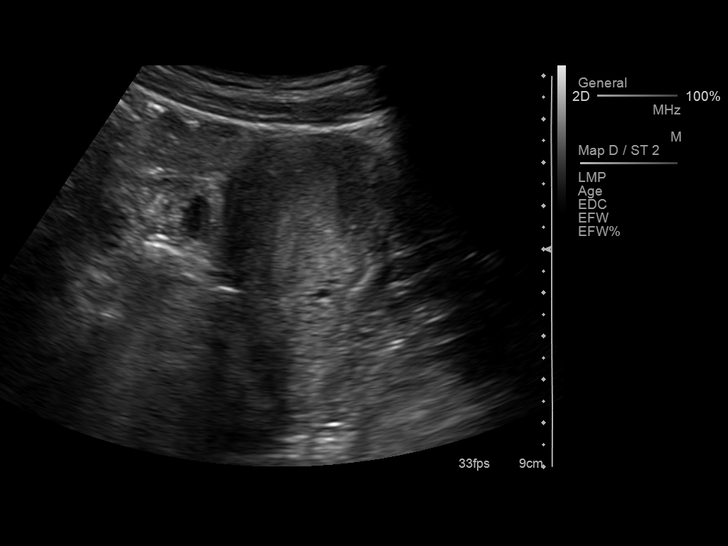
[im 8/65]
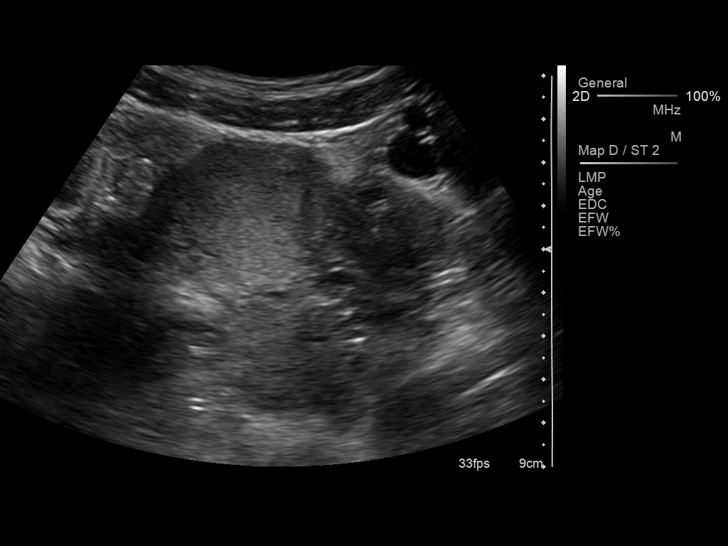
[im 12/65]
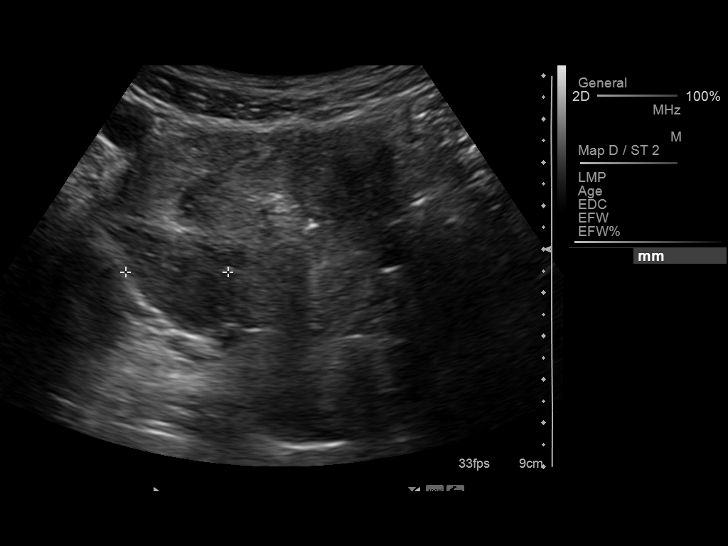
[im 17/65]
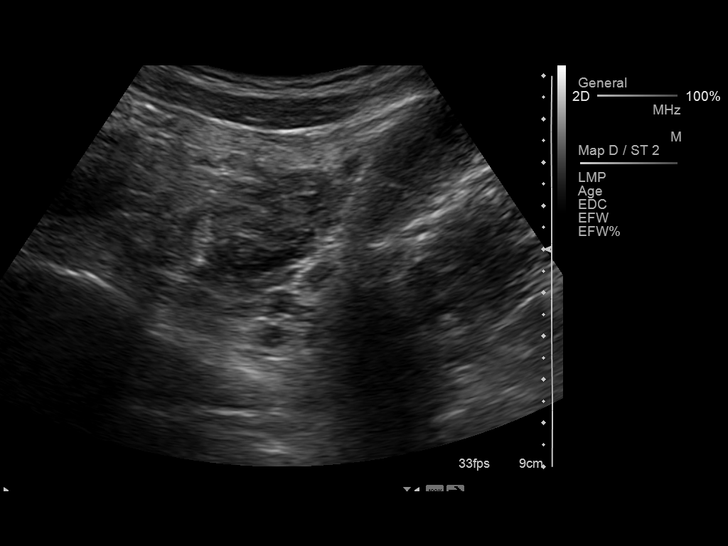
[im 22/65]
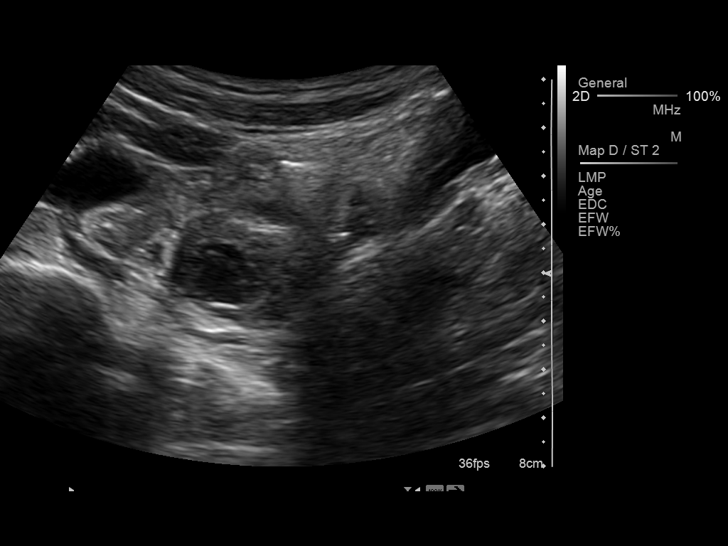
[im 27/65]
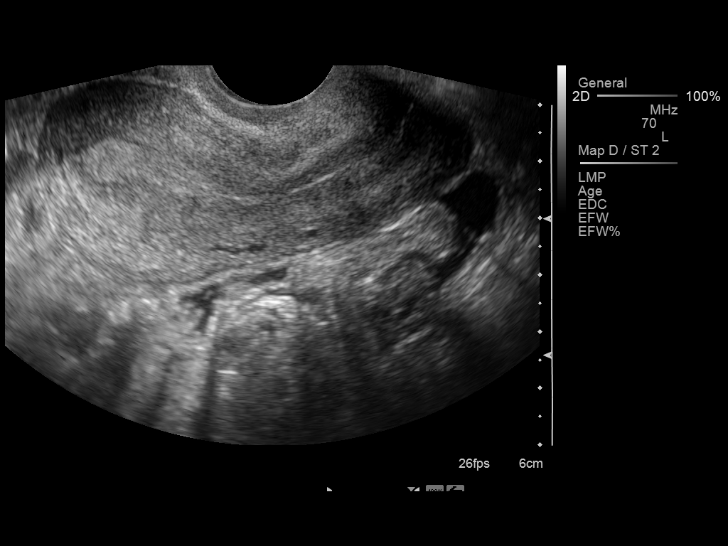
[im 34/65]
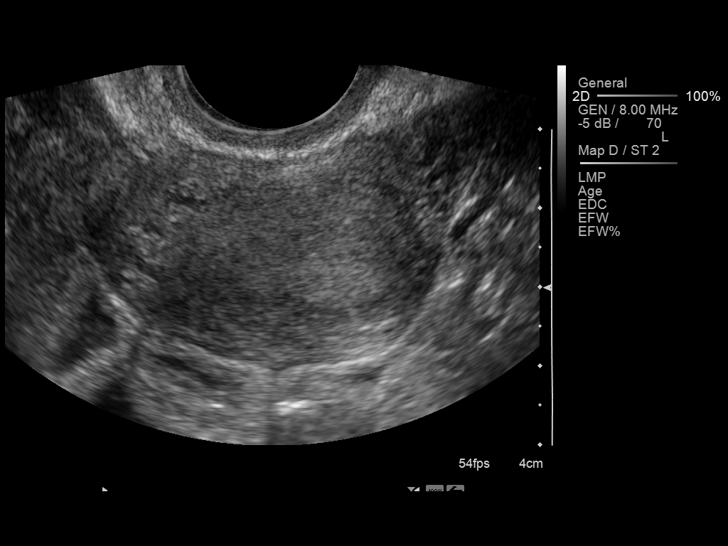
[im 38/65]
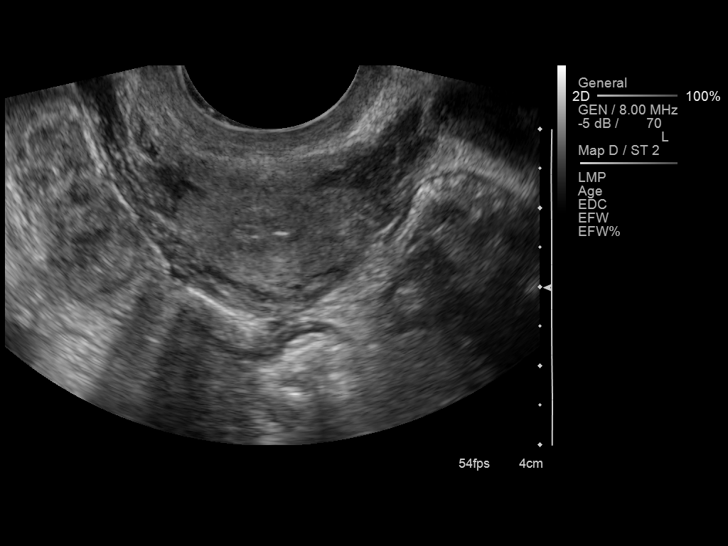
[im 43/65]
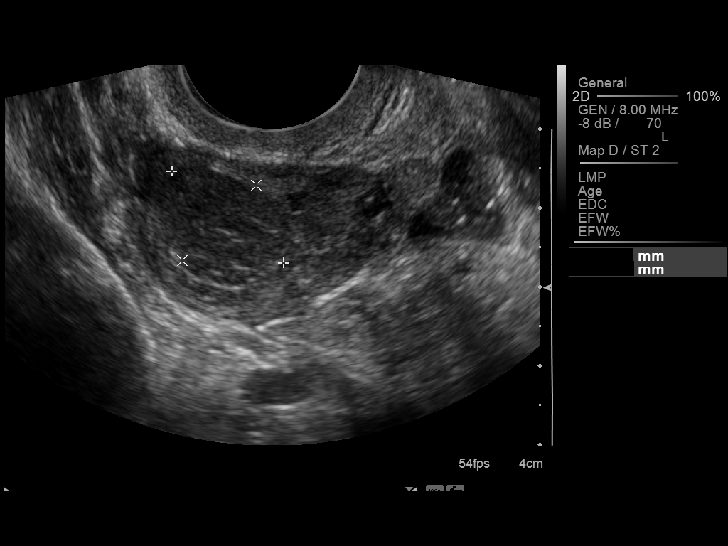
[im 48/65]
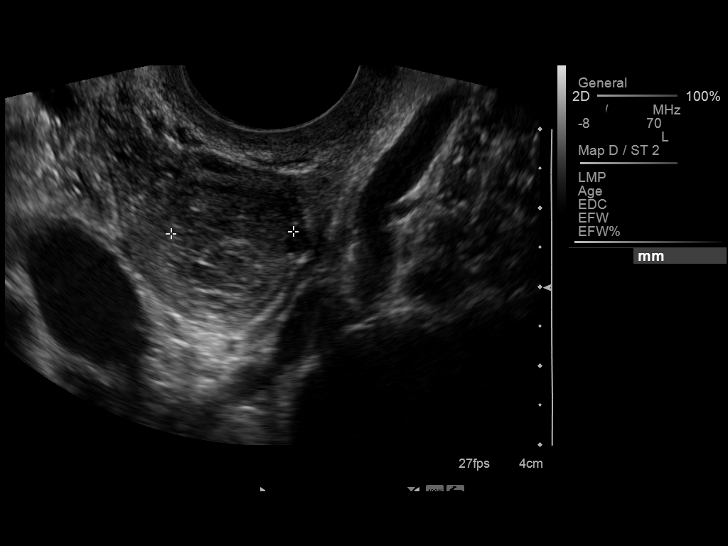
[im 53/65]
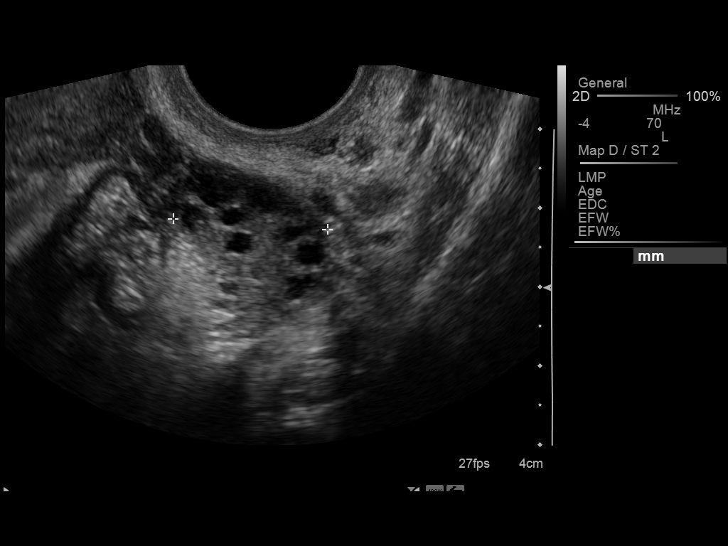
[im 57/65]
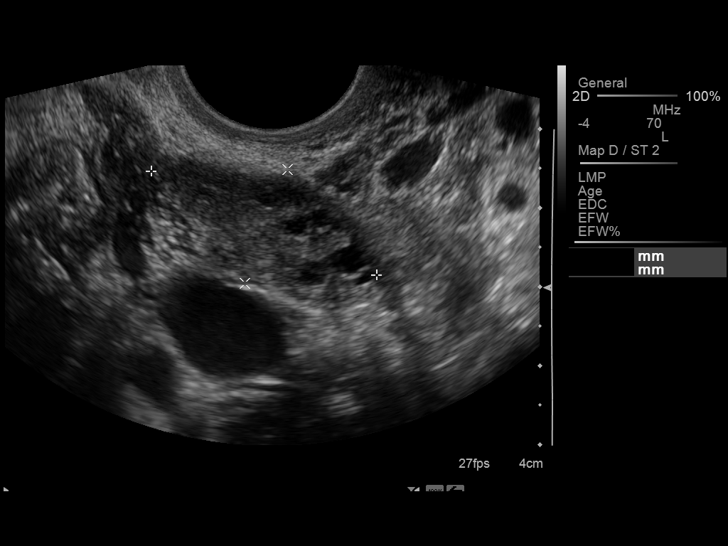
[im 62/65]
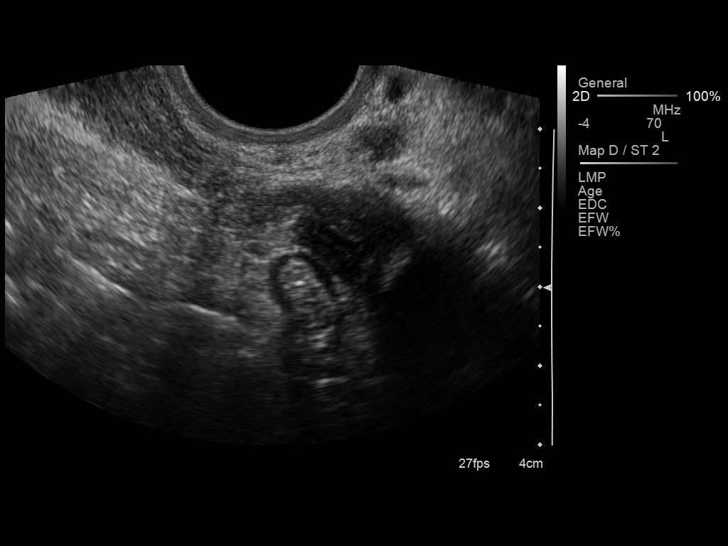

[13 of 28 positions shown; findings below may reference images not displayed]

Intrauterine gestational sac:  None seen.
Yolk sac: N/A
Embryo: N/A

Maternal uterus/adnexae:
The uterus is unremarkable in appearance.  The endometrial echo
complex measures 0.7 cm in thickness, within normal limits.

The ovaries are within normal limits.  The right ovary measures
x 2.5 x 2.0 cm, while the left ovary measures 3.1 x 2.0 x 1.5 cm.
A likely corpus luteal cyst is noted at the right ovary, measuring
1.8 cm.  Limited Doppler evaluation demonstrates grossly normal
color Doppler blood flow with respect to both ovaries.

A small amount of free fluid is seen within the pelvic cul-de-sac,
likely physiologic in nature.
IMPRESSION: No intrauterine gestational sac yet seen.  This remains within
normal limits, given the quantitative beta HCG level of 58.  No
ectopic pregnancy identified at this time.  Would perform follow-up
pelvic ultrasound in 2 weeks, for further evaluation.

## 2016-10-29 ENCOUNTER — Inpatient Hospital Stay (HOSPITAL_COMMUNITY)
Admission: AD | Admit: 2016-10-29 | Discharge: 2016-10-29 | Disposition: A | Discharge status: Home / Self Care | Payer: Medicaid Other | Source: Ambulatory Visit | Attending: Family Medicine | Admitting: Family Medicine

## 2016-10-29 ENCOUNTER — Encounter (HOSPITAL_COMMUNITY): Payer: Self-pay | Admitting: *Deleted

## 2016-10-29 DIAGNOSIS — R109 Unspecified abdominal pain: Secondary | ICD-10-CM

## 2016-10-29 DIAGNOSIS — R350 Frequency of micturition: Secondary | ICD-10-CM | POA: Diagnosis not present

## 2016-10-29 DIAGNOSIS — R110 Nausea: Secondary | ICD-10-CM | POA: Diagnosis not present

## 2016-10-29 DIAGNOSIS — O26893 Other specified pregnancy related conditions, third trimester: Secondary | ICD-10-CM | POA: Insufficient documentation

## 2016-10-29 DIAGNOSIS — F17210 Nicotine dependence, cigarettes, uncomplicated: Secondary | ICD-10-CM | POA: Insufficient documentation

## 2016-10-29 DIAGNOSIS — O99333 Smoking (tobacco) complicating pregnancy, third trimester: Secondary | ICD-10-CM | POA: Diagnosis not present

## 2016-10-29 DIAGNOSIS — Z8249 Family history of ischemic heart disease and other diseases of the circulatory system: Secondary | ICD-10-CM | POA: Insufficient documentation

## 2016-10-29 DIAGNOSIS — M549 Dorsalgia, unspecified: Secondary | ICD-10-CM | POA: Diagnosis not present

## 2016-10-29 DIAGNOSIS — Z3A35 35 weeks gestation of pregnancy: Secondary | ICD-10-CM | POA: Diagnosis not present

## 2016-10-29 LAB — CBC WITH DIFFERENTIAL/PLATELET
Basophils Absolute: 0 10*3/uL (ref 0.0–0.1)
Basophils Relative: 0 %
Eosinophils Absolute: 0.2 10*3/uL (ref 0.0–0.7)
Eosinophils Relative: 1 %
HEMATOCRIT: 30.3 % — AB (ref 36.0–46.0)
HEMOGLOBIN: 10.3 g/dL — AB (ref 12.0–15.0)
LYMPHS ABS: 2.5 10*3/uL (ref 0.7–4.0)
LYMPHS PCT: 20 %
MCH: 30.7 pg (ref 26.0–34.0)
MCHC: 34 g/dL (ref 30.0–36.0)
MCV: 90.2 fL (ref 78.0–100.0)
MONOS PCT: 4 %
Monocytes Absolute: 0.5 10*3/uL (ref 0.1–1.0)
NEUTROS PCT: 75 %
Neutro Abs: 9.5 10*3/uL — ABNORMAL HIGH (ref 1.7–7.7)
Platelets: 201 10*3/uL (ref 150–400)
RBC: 3.36 MIL/uL — AB (ref 3.87–5.11)
RDW: 13.7 % (ref 11.5–15.5)
WBC: 12.7 10*3/uL — ABNORMAL HIGH (ref 4.0–10.5)

## 2016-10-29 LAB — RAPID URINE DRUG SCREEN, HOSP PERFORMED
AMPHETAMINES: NOT DETECTED
BARBITURATES: NOT DETECTED
Benzodiazepines: NOT DETECTED
Cocaine: NOT DETECTED
Opiates: NOT DETECTED
TETRAHYDROCANNABINOL: NOT DETECTED

## 2016-10-29 LAB — URINALYSIS, ROUTINE W REFLEX MICROSCOPIC
BILIRUBIN URINE: NEGATIVE
Glucose, UA: NEGATIVE mg/dL
Hgb urine dipstick: NEGATIVE
Ketones, ur: NEGATIVE mg/dL
NITRITE: NEGATIVE
PH: 7 (ref 5.0–8.0)
Protein, ur: NEGATIVE mg/dL
SPECIFIC GRAVITY, URINE: 1.016 (ref 1.005–1.030)

## 2016-10-29 LAB — BASIC METABOLIC PANEL
Anion gap: 8 (ref 5–15)
BUN: 6 mg/dL (ref 6–20)
CHLORIDE: 107 mmol/L (ref 101–111)
CO2: 19 mmol/L — AB (ref 22–32)
Calcium: 8.4 mg/dL — ABNORMAL LOW (ref 8.9–10.3)
GLUCOSE: 81 mg/dL (ref 65–99)
POTASSIUM: 3.7 mmol/L (ref 3.5–5.1)
Sodium: 134 mmol/L — ABNORMAL LOW (ref 135–145)

## 2016-10-29 MED ORDER — ACETAMINOPHEN 325 MG PO TABS
650.0000 mg | ORAL_TABLET | Freq: Once | ORAL | Status: DC
  Filled 2016-10-29: qty 2

## 2016-10-29 MED ORDER — TERBUTALINE SULFATE 1 MG/ML IJ SOLN
0.2500 mg | Freq: Once | INTRAMUSCULAR | Status: AC
  Administered 2016-10-29: 0.25 mg via SUBCUTANEOUS
  Filled 2016-10-29: qty 1

## 2016-10-29 MED ORDER — TERBUTALINE SULFATE 1 MG/ML IJ SOLN: 0.2500 mg | Freq: Once | INTRAMUSCULAR | Status: DC

## 2016-10-29 NOTE — Discharge Instructions (Signed)
Abdominal Pain During Pregnancy Abdominal pain is common in pregnancy. Most of the time, it does not cause harm. There are many causes of abdominal pain. Some causes are more serious than others and sometimes the cause is not known. Abdominal pain can be a sign that something is very wrong with the pregnancy or the pain may have nothing to do with the pregnancy. Always tell your health care provider if you have any abdominal pain. Follow these instructions at home:  Do not have sex or put anything in your vagina until your symptoms go away completely.  Watch your abdominal pain for any changes.  Get plenty of rest until your pain improves.  Drink enough fluid to keep your urine clear or pale yellow.  Take over-the-counter or prescription medicines only as told by your health care provider.  Keep all follow-up visits as told by your health care provider. This is important. Contact a health care provider if:  You have a fever.  Your pain gets worse or you have cramping.  Your pain continues after resting. Get help right away if:  You are bleeding, leaking fluid, or passing tissue from the vagina.  You have vomiting or diarrhea that does not go away.  You have painful or bloody urination.  You notice a decrease in your baby's movements.  You feel very weak or faint.  You have shortness of breath.  You develop a severe headache with abdominal pain.  You have abnormal vaginal discharge with abdominal pain. This information is not intended to replace advice given to you by your health care provider. Make sure you discuss any questions you have with your health care provider. Document Released: 01/14/2005 Document Revised: 10/26/2015 Document Reviewed: 08/13/2012 Elsevier Interactive Patient Education  [employer].  Your monitor tracing today shows that the baby is active and no contractions during the time you have been here. Your blood and urine do not show signs of  dehydration. If you are having nausea and diarrhea stay on clear liquids for the next 12 hours. Be sure you are taking your prenatal vitamins and any other medications that your OB has prescribed for you. Continue prenatal care. Return as needed.

## 2016-10-29 NOTE — MAU Note (Signed)
Pt reports contractions since yesterday, SVE 1.5 . Discharged home. Contractions worsened today. Denies bleeding or ROM
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
## 2017-03-02 ENCOUNTER — Encounter (HOSPITAL_COMMUNITY): Payer: Self-pay

## 2018-01-05 ENCOUNTER — Encounter (HOSPITAL_COMMUNITY): Payer: Self-pay | Admitting: Nurse Practitioner

## 2018-01-05 ENCOUNTER — Emergency Department (HOSPITAL_COMMUNITY)
Admission: EM | Admit: 2018-01-05 | Discharge: 2018-01-05 | Disposition: A | Discharge status: Home / Self Care | Payer: Medicaid Other | Attending: Emergency Medicine | Admitting: Emergency Medicine

## 2018-01-05 DIAGNOSIS — R1030 Lower abdominal pain, unspecified: Secondary | ICD-10-CM | POA: Insufficient documentation

## 2018-01-05 DIAGNOSIS — Z5321 Procedure and treatment not carried out due to patient leaving prior to being seen by health care provider: Secondary | ICD-10-CM | POA: Diagnosis not present

## 2018-01-05 LAB — CBC
HCT: 44.1 % (ref 36.0–46.0)
Hemoglobin: 14.3 g/dL (ref 12.0–15.0)
MCH: 29.4 pg (ref 26.0–34.0)
MCHC: 32.4 g/dL (ref 30.0–36.0)
MCV: 90.6 fL (ref 80.0–100.0)
PLATELETS: 264 10*3/uL (ref 150–400)
RBC: 4.87 MIL/uL (ref 3.87–5.11)
RDW: 14.6 % (ref 11.5–15.5)
WBC: 15 10*3/uL — ABNORMAL HIGH (ref 4.0–10.5)
nRBC: 0 % (ref 0.0–0.2)

## 2018-01-05 LAB — URINALYSIS, ROUTINE W REFLEX MICROSCOPIC
Bilirubin Urine: NEGATIVE
Glucose, UA: NEGATIVE mg/dL
Hgb urine dipstick: NEGATIVE
Ketones, ur: NEGATIVE mg/dL
Nitrite: NEGATIVE
Protein, ur: NEGATIVE mg/dL
SPECIFIC GRAVITY, URINE: 1.023 (ref 1.005–1.030)
pH: 6 (ref 5.0–8.0)

## 2018-01-05 LAB — I-STAT BETA HCG BLOOD, ED (MC, WL, AP ONLY)

## 2018-01-05 LAB — COMPREHENSIVE METABOLIC PANEL
ALBUMIN: 4.5 g/dL (ref 3.5–5.0)
ALK PHOS: 45 U/L (ref 38–126)
ALT: 13 U/L (ref 0–44)
AST: 9 U/L — AB (ref 15–41)
Anion gap: 10 (ref 5–15)
BILIRUBIN TOTAL: 0.9 mg/dL (ref 0.3–1.2)
BUN: 12 mg/dL (ref 6–20)
CALCIUM: 9.6 mg/dL (ref 8.9–10.3)
CO2: 27 mmol/L (ref 22–32)
Chloride: 101 mmol/L (ref 98–111)
Creatinine, Ser: 0.72 mg/dL (ref 0.44–1.00)
GFR calc Af Amer: >60 mL/min (ref >60)
GFR calc non Af Amer: >60 mL/min (ref >60)
GLUCOSE: 99 mg/dL (ref 70–99)
Potassium: 4.1 mmol/L (ref 3.5–5.1)
Sodium: 138 mmol/L (ref 135–145)
TOTAL PROTEIN: 7 g/dL (ref 6.5–8.1)

## 2018-01-05 LAB — LIPASE, BLOOD: Lipase: 29 U/L (ref 11–51)

## 2018-01-05 NOTE — ED Triage Notes (Signed)
Pt is c/o lower abdominal pain which she reports has gotten progressively worse and describes it as pressure. Onset yesterday.

## 2018-01-05 NOTE — ED Notes (Signed)
Called for room x3. Pt not in lobby.

## 2018-01-05 NOTE — ED Notes (Signed)
Called for room x2. No answer. 

## 2018-01-05 NOTE — ED Notes (Signed)
Called for room x1. No answer. 

## 2018-02-16 DIAGNOSIS — R87612 Low grade squamous intraepithelial lesion on cytologic smear of cervix (LGSIL): Secondary | ICD-10-CM | POA: Insufficient documentation

## 2018-02-25 ENCOUNTER — Ambulatory Visit: Payer: Medicaid Other | Admitting: Obstetrics and Gynecology

## 2018-04-01 ENCOUNTER — Ambulatory Visit: Payer: Medicaid Other | Admitting: Obstetrics and Gynecology

## 2022-05-22 ENCOUNTER — Other Ambulatory Visit (HOSPITAL_COMMUNITY)
Admission: RE | Admit: 2022-05-22 | Discharge: 2022-05-22 | Disposition: A | Discharge status: Home / Self Care | Payer: Medicaid Other | Source: Ambulatory Visit | Attending: Physician Assistant | Admitting: Physician Assistant

## 2022-05-22 ENCOUNTER — Ambulatory Visit: Payer: Medicaid Other | Admitting: Physician Assistant

## 2022-05-22 ENCOUNTER — Encounter: Payer: Self-pay | Admitting: Physician Assistant

## 2022-05-22 VITALS — BP 108/70 | HR 81 | Ht <= 58 in | Wt 97.0 lb

## 2022-05-22 DIAGNOSIS — R399 Unspecified symptoms and signs involving the genitourinary system: Secondary | ICD-10-CM

## 2022-05-22 DIAGNOSIS — Z3202 Encounter for pregnancy test, result negative: Secondary | ICD-10-CM | POA: Diagnosis not present

## 2022-05-22 DIAGNOSIS — R768 Other specified abnormal immunological findings in serum: Secondary | ICD-10-CM

## 2022-05-22 DIAGNOSIS — F17210 Nicotine dependence, cigarettes, uncomplicated: Secondary | ICD-10-CM

## 2022-05-22 DIAGNOSIS — Z124 Encounter for screening for malignant neoplasm of cervix: Secondary | ICD-10-CM

## 2022-05-22 DIAGNOSIS — Z113 Encounter for screening for infections with a predominantly sexual mode of transmission: Secondary | ICD-10-CM | POA: Insufficient documentation

## 2022-05-22 DIAGNOSIS — Z0001 Encounter for general adult medical examination with abnormal findings: Secondary | ICD-10-CM

## 2022-05-22 DIAGNOSIS — B353 Tinea pedis: Secondary | ICD-10-CM | POA: Diagnosis not present

## 2022-05-22 DIAGNOSIS — A549 Gonococcal infection, unspecified: Secondary | ICD-10-CM | POA: Diagnosis not present

## 2022-05-22 DIAGNOSIS — B9689 Other specified bacterial agents as the cause of diseases classified elsewhere: Secondary | ICD-10-CM

## 2022-05-22 DIAGNOSIS — Z0000 Encounter for general adult medical examination without abnormal findings: Secondary | ICD-10-CM

## 2022-05-22 DIAGNOSIS — N760 Acute vaginitis: Secondary | ICD-10-CM

## 2022-05-22 LAB — POCT URINALYSIS DIP (CLINITEK)
Bilirubin, UA: NEGATIVE
Blood, UA: NEGATIVE
Glucose, UA: NEGATIVE mg/dL
Ketones, POC UA: NEGATIVE mg/dL
Nitrite, UA: NEGATIVE
POC PROTEIN,UA: NEGATIVE
Spec Grav, UA: >=1.03 — AB (ref 1.010–1.025)
Urobilinogen, UA: 0.2 E.U./dL
pH, UA: 6.5 (ref 5.0–8.0)

## 2022-05-22 LAB — POCT URINE PREGNANCY: Preg Test, Ur: NEGATIVE

## 2022-05-22 MED ORDER — NYSTATIN 100000 UNIT/GM EX CREA
1.0000 | TOPICAL_CREAM | Freq: Two times a day (BID) | CUTANEOUS | 0 refills | Status: AC
Start: 2022-05-22 — End: ?

## 2022-05-22 NOTE — Progress Notes (Unsigned)
   New Patient Office Visit  Subjective    Patient ID: Kristie Williams, female    DOB: [birth year]  Age: 32 y.o. MRN: [id number]  CC: No chief complaint on file.   HPI Kristie Williams presents to establish care  Would like a physical -  Blood  - little bit of dysuria  No discharge  Past 3-4 days   Swollen lymph node in supra pubic  No suprapubic pain / fever or nausea  Both feet with dry cracked - itchy -for the past year  - homeless -   Am gynecolo No chance  - no birth control     No outpatient encounter medications on file as of 05/22/2022.   No facility-administered encounter medications on file as of 05/22/2022.    Past Medical History:  Diagnosis Date   Abnormal Pap smear    Hyperemesis arising during pregnancy    PID (acute pelvic inflammatory disease)     Past Surgical History:  Procedure Laterality Date   COLPOSCOPY VULVA W/ BIOPSY     KNEE SURGERY     right side   WISDOM TOOTH EXTRACTION      Family History  Problem Relation Age of Onset   Heart disease Mother    Heart disease Sister    Cancer Maternal Aunt    Cancer Paternal Aunt     Social History   Socioeconomic History   Marital status: Single    Spouse name: Not on file   Number of children: Not on file   Years of education: Not on file   Highest education level: Not on file  Occupational History   Not on file  Tobacco Use   Smoking status: Every Day    Packs/day: .5    Types: Cigarettes   Smokeless tobacco: Current  Substance and Sexual Activity   Alcohol use: No   Drug use: Yes    Types: Marijuana    Comment: yesterday   Sexual activity: Yes  Other Topics Concern   Not on file  Social History Narrative   Not on file   Social Determinants of Health   Financial Resource Strain: Not on file  Food Insecurity: Not on file  Transportation Needs: Not on file  Physical Activity: Not on file  Stress: Not on file  Social Connections: Not on file  Intimate Partner Violence: Not  on file    ROS      Objective    There were no vitals taken for this visit.  Physical Exam  {Labs (Optional):23779}    Assessment & Plan:   Problem List Items Addressed This Visit   None   No follow-ups on file.   Kasandra Knudsen Mayers, PA-C

## 2022-05-22 NOTE — Patient Instructions (Signed)
You did not show signs of a urinary tract infection.  We have sent your urine off for culture for further evaluation.  I do encourage you to increase your water intake.  To help with your feet, I did send nystatin cream to your pharmacy.  I encourage you to continue working on keeping your feet clean and dry.  I started a referral for you to be seen by gynecology.  I encourage you to make an appointment with your primary care provider to establish care, you are welcome to return to the mobile unit as needed.  We will call you with today's lab results.  Roney Jaffe, PA-C Physician Assistant [facility] [email or web address]   Athlete's Foot Athlete's foot (tinea pedis) is a fungal infection of the skin on your feet. It often occurs on the skin that is between or underneath the toes. It can also occur on the soles of your feet. The infection can spread from person to person (is contagious). It can also spread when a person's bare feet come in contact with the fungus on shower floors or on items such as shoes. What are the causes? This condition is caused by a fungus that grows in warm, moist places. You can get athlete's foot by sharing shoes, shower stalls, towels, and wet floors with someone who is infected. Not washing your feet or changing your socks often enough can also lead to athlete's foot. What increases the risk? This condition is more likely to develop in: Men. People who have a weak body defense system (immune system). People who have diabetes. People who use public showers, such as at a gym. People who wear heavy-duty shoes, such as Youth worker. Seasons with warm, humid weather. What are the signs or symptoms? Symptoms of this condition include: Itchy areas between your toes or on the soles of your feet. White, flaky, or scaly areas between your toes or on the soles of your feet. Very itchy small  blisters between your toes or on the soles of your feet. Small cuts in your skin. These cuts can become infected. Thick or discolored toenails. How is this diagnosed? This condition may be diagnosed with a physical exam and a review of your medical history. Your health care provider may also take a skin or toenail sample to examine under a microscope. How is this treated? This condition is treated with antifungal medicines. These may be applied as powders, ointments, or creams. In severe cases, an oral antifungal medicine may be given. Follow these instructions at home: Medicines Apply or take over-the-counter and prescription medicines only as told by your health care provider. Apply your antifungal medicine as told by your health care provider. Do not stop using the antifungal even if your condition improves. Foot care Do not scratch your feet. Keep your feet dry: Wear cotton or wool socks. Change your socks every day or if they become wet. Wear shoes that allow air to flow, such as sandals or canvas tennis shoes. Wash and dry your feet, including the area between your toes. Also, wash and dry your feet: Every day or as told by your health care provider. After exercising. General instructions Do not let others use towels, shoes, nail clippers, or other personal items that touch your feet. Protect your feet by wearing sandals in wet areas, such as locker rooms and shared showers. Keep all follow-up visits. This is important. If you have diabetes, keep your blood sugar under control. Contact a  health care provider if: You have a fever. You have swelling, soreness, warmth, or redness in your foot. Your feet are not getting better with treatment. Your symptoms get worse. You have new symptoms. You have severe pain. Summary Athlete's foot (tinea pedis) is a fungal infection of the skin on your feet. It often occurs on skin that is between or underneath the toes. This condition is caused  by a fungus that grows in warm, moist places. Symptoms include white, flaky, or scaly areas between your toes or on the soles of your feet. This condition is treated with antifungal medicines. Keep your feet clean. Always dry them thoroughly. This information is not intended to replace advice given to you by your health care provider. Make sure you discuss any questions you have with your health care provider. Document Revised: 05/07/2020 Document Reviewed: 05/07/2020 Elsevier Patient Education  2023 [employer].

## 2022-05-23 DIAGNOSIS — B353 Tinea pedis: Secondary | ICD-10-CM | POA: Insufficient documentation

## 2022-05-23 LAB — CERVICOVAGINAL ANCILLARY ONLY
Bacterial Vaginitis (gardnerella): POSITIVE — AB
Candida Glabrata: NEGATIVE
Candida Vaginitis: NEGATIVE
Chlamydia: NEGATIVE
Comment: NEGATIVE
Comment: NEGATIVE
Comment: NEGATIVE
Comment: NEGATIVE
Comment: NEGATIVE
Comment: NORMAL
Neisseria Gonorrhea: POSITIVE — AB
Trichomonas: NEGATIVE

## 2022-05-23 MED ORDER — METRONIDAZOLE 500 MG PO TABS
500.0000 mg | ORAL_TABLET | Freq: Two times a day (BID) | ORAL | 0 refills | Status: AC
Start: 2022-05-23 — End: 2022-05-30

## 2022-05-23 MED ORDER — CEFTRIAXONE SODIUM 500 MG IJ SOLR
500.0000 mg | Freq: Once | INTRAMUSCULAR | Status: AC
Start: 2022-05-23 — End: 2022-05-22
  Administered 2022-05-22: 500 mg via INTRAMUSCULAR

## 2022-05-23 NOTE — Addendum Note (Signed)
Addended by: Roney Jaffe on: 05/23/2022 02:24 PM   Modules accepted: Orders

## 2022-05-24 LAB — URINE CULTURE

## 2022-05-29 ENCOUNTER — Ambulatory Visit: Payer: Medicaid Other

## 2022-05-29 VITALS — BP 106/68 | HR 91 | Ht <= 58 in | Wt 97.0 lb

## 2022-05-29 DIAGNOSIS — A549 Gonococcal infection, unspecified: Secondary | ICD-10-CM | POA: Diagnosis not present

## 2022-05-29 MED ORDER — CEFTRIAXONE SODIUM 500 MG IJ SOLR
500.0000 mg | Freq: Once | INTRAMUSCULAR | Status: AC
Start: 2022-05-29 — End: 2022-05-29
  Administered 2022-05-29: 500 mg via INTRAMUSCULAR

## 2022-05-29 NOTE — Progress Notes (Signed)
Patient informed of Positive Results for Gonorrhea. She agreed to present to [facility] [place] [facility] for [place].   500mg  of Ceftriaxone IM injection given today in RUOQ. Patient tolerated well with no complaints, is due to return for retesting in 4 weeks.   All other labs are pending.

## 2022-05-30 LAB — CBC WITH DIFFERENTIAL/PLATELET
Basophils Absolute: 0 10*3/uL (ref 0.0–0.2)
Basos: 0 %
EOS (ABSOLUTE): 0.1 10*3/uL (ref 0.0–0.4)
Eos: 1 %
Hematocrit: 38.9 % (ref 34.0–46.6)
Hemoglobin: 12.7 g/dL (ref 11.1–15.9)
Immature Grans (Abs): 0 10*3/uL (ref 0.0–0.1)
Immature Granulocytes: 0 %
Lymphocytes Absolute: 1.9 10*3/uL (ref 0.7–3.1)
Lymphs: 19 %
MCH: 30 pg (ref 26.6–33.0)
MCHC: 32.6 g/dL (ref 31.5–35.7)
MCV: 92 fL (ref 79–97)
Monocytes Absolute: 0.6 10*3/uL (ref 0.1–0.9)
Monocytes: 6 %
Neutrophils Absolute: 7.4 10*3/uL — ABNORMAL HIGH (ref 1.4–7.0)
Neutrophils: 74 %
Platelets: 259 10*3/uL (ref 150–450)
RBC: 4.23 x10E6/uL (ref 3.77–5.28)
RDW: 13.6 % (ref 11.7–15.4)
WBC: 10 10*3/uL (ref 3.4–10.8)

## 2022-05-30 LAB — COMP. METABOLIC PANEL (12)
AST: 7 IU/L (ref 0–40)
Albumin/Globulin Ratio: 1.7 (ref 1.2–2.2)
Albumin: 4.1 g/dL (ref 3.9–4.9)
Alkaline Phosphatase: 56 IU/L (ref 44–121)
BUN/Creatinine Ratio: 12 (ref 9–23)
BUN: 8 mg/dL (ref 6–20)
Bilirubin Total: 0.4 mg/dL (ref 0.0–1.2)
Calcium: 8.9 mg/dL (ref 8.7–10.2)
Chloride: 101 mmol/L (ref 96–106)
Creatinine, Ser: 0.66 mg/dL (ref 0.57–1.00)
Globulin, Total: 2.4 g/dL (ref 1.5–4.5)
Glucose: 78 mg/dL (ref 70–99)
Potassium: 4 mmol/L (ref 3.5–5.2)
Sodium: 137 mmol/L (ref 134–144)
Total Protein: 6.5 g/dL (ref 6.0–8.5)
eGFR: 120 mL/min/{1.73_m2} (ref >59)

## 2022-05-30 LAB — HCV RT-PCR, QUANT (NON-GRAPH)

## 2022-05-30 LAB — HCV AB W REFLEX TO QUANT PCR: HCV Ab: REACTIVE — AB

## 2022-05-30 LAB — HIV ANTIBODY (ROUTINE TESTING W REFLEX): HIV Screen 4th Generation wRfx: NONREACTIVE

## 2022-05-30 LAB — TSH: TSH: 2.95 u[IU]/mL (ref 0.450–4.500)

## 2022-05-30 LAB — RPR: RPR Ser Ql: NONREACTIVE

## 2022-05-30 NOTE — Addendum Note (Signed)
Addended by: Roney Jaffe on: 05/30/2022 12:30 PM   Modules accepted: Orders

## 2022-08-29 ENCOUNTER — Other Ambulatory Visit: Payer: Self-pay

## 2022-08-29 ENCOUNTER — Encounter (HOSPITAL_COMMUNITY): Payer: Self-pay

## 2022-08-29 ENCOUNTER — Emergency Department (HOSPITAL_COMMUNITY)
Admission: EM | Admit: 2022-08-29 | Discharge: 2022-08-29 | Disposition: A | Discharge status: Home / Self Care | Payer: 59 | Attending: Emergency Medicine | Admitting: Emergency Medicine

## 2022-08-29 DIAGNOSIS — R197 Diarrhea, unspecified: Secondary | ICD-10-CM | POA: Insufficient documentation

## 2022-08-29 DIAGNOSIS — R112 Nausea with vomiting, unspecified: Secondary | ICD-10-CM | POA: Insufficient documentation

## 2022-08-29 DIAGNOSIS — R1084 Generalized abdominal pain: Secondary | ICD-10-CM | POA: Insufficient documentation

## 2022-08-29 DIAGNOSIS — D72829 Elevated white blood cell count, unspecified: Secondary | ICD-10-CM | POA: Diagnosis not present

## 2022-08-29 DIAGNOSIS — R400 Somnolence: Secondary | ICD-10-CM | POA: Diagnosis not present

## 2022-08-29 LAB — HCG, SERUM, QUALITATIVE: Preg, Serum: NEGATIVE

## 2022-08-29 LAB — COMPREHENSIVE METABOLIC PANEL
ALT: 15 U/L (ref 0–44)
AST: 11 U/L — ABNORMAL LOW (ref 15–41)
Albumin: 4.2 g/dL (ref 3.5–5.0)
Alkaline Phosphatase: 49 U/L (ref 38–126)
Anion gap: 12 (ref 5–15)
BUN: 12 mg/dL (ref 6–20)
CO2: 24 mmol/L (ref 22–32)
Calcium: 9.1 mg/dL (ref 8.9–10.3)
Chloride: 96 mmol/L — ABNORMAL LOW (ref 98–111)
Creatinine, Ser: 0.67 mg/dL (ref 0.44–1.00)
GFR, Estimated: >60 mL/min (ref >60)
Glucose, Bld: 122 mg/dL — ABNORMAL HIGH (ref 70–99)
Potassium: 3.6 mmol/L (ref 3.5–5.1)
Sodium: 132 mmol/L — ABNORMAL LOW (ref 135–145)
Total Bilirubin: 0.4 mg/dL (ref 0.3–1.2)
Total Protein: 7.4 g/dL (ref 6.5–8.1)

## 2022-08-29 LAB — URINALYSIS, ROUTINE W REFLEX MICROSCOPIC
Bilirubin Urine: NEGATIVE
Glucose, UA: NEGATIVE mg/dL
Hgb urine dipstick: NEGATIVE
Ketones, ur: 20 mg/dL — AB
Leukocytes,Ua: NEGATIVE
Nitrite: NEGATIVE
Protein, ur: 100 mg/dL — AB
Specific Gravity, Urine: 1.024 (ref 1.005–1.030)
pH: 7 (ref 5.0–8.0)

## 2022-08-29 LAB — CBC
HCT: 41.3 % (ref 36.0–46.0)
Hemoglobin: 13.5 g/dL (ref 12.0–15.0)
MCH: 30 pg (ref 26.0–34.0)
MCHC: 32.7 g/dL (ref 30.0–36.0)
MCV: 91.8 fL (ref 80.0–100.0)
Platelets: 256 10*3/uL (ref 150–400)
RBC: 4.5 MIL/uL (ref 3.87–5.11)
RDW: 13.5 % (ref 11.5–15.5)
WBC: 14.4 10*3/uL — ABNORMAL HIGH (ref 4.0–10.5)
nRBC: 0 % (ref 0.0–0.2)

## 2022-08-29 LAB — LIPASE, BLOOD: Lipase: 24 U/L (ref 11–51)

## 2022-08-29 MED ORDER — ONDANSETRON HCL 4 MG PO TABS: 4.0000 mg | ORAL_TABLET | Freq: Three times a day (TID) | ORAL | Status: DC | PRN

## 2022-08-29 MED ORDER — ONDANSETRON HCL 4 MG PO TABS: 4.0000 mg | ORAL_TABLET | Freq: Three times a day (TID) | ORAL | 0 refills | Status: AC | PRN

## 2022-08-29 MED ORDER — ONDANSETRON HCL 4 MG/2ML IJ SOLN
4.0000 mg | Freq: Once | INTRAMUSCULAR | Status: AC
  Administered 2022-08-29: 4 mg via INTRAVENOUS
  Filled 2022-08-29: qty 2

## 2022-08-29 NOTE — ED Triage Notes (Signed)
Pt reports waking up with lower abdominal pain, nausea, and vomiting. Pt AxOx4.

## 2022-08-29 NOTE — ED Provider Notes (Signed)
[place] EMERGENCY DEPARTMENT AT [facility] Provider Note  MDM   HPI/ROS:  Kristie Williams is a 32 y.o. female with history of STIs and PID presenting with chief complaint of nausea/vomiting, abdominal pain, and subjective fevers.  Symptoms started last night.  Denies urinary symptoms.  Patient is especially somnolent and minimally cooperative with initial history taking. Per chart review, patient was recently treated for gonorrhea in May of this year.  Physical exam is notable for: - Vital signs stable, afebrile. - Somnolent but arousable.  No acute distress - No significant abdominal tenderness to palpation  On my initial evaluation, patient is:  -Vital signs stable. Patient afebrile, hemodynamically stable, and non-toxic appearing. -Additional history obtained from chart review.  Given the patient's history and physical exam, differential diagnosis includes but is not limited to gastroenteritis, urinary tract infection, appendicitis, hepatobiliary pathology, STDs, etc.  Low concern for severe or life-threatening abdominal pathology given that abdominal exam is benign.  Low concern for UTI given the patient denies urinary symptoms.  Interpretations, interventions, and the patient's course of care are documented below.    Initial workup to include CBC, CMP, lipase, hCG, urinalysis.  IV Zofran administered for nausea.  Upon reassessment, patient nods her head when asked if she is feeling better.  After standing patient and walking to the bathroom to obtain urine sample, she is more cooperative with history.  She denies recent fevers, pelvic pain, vaginal discharge, dysuria, vaginal pain.  Expresses low concern for sexually transmitted infections.  Claims she is feeling much better now after Zofran.  Patient has been residing at a homeless shelter as of recently, and is comfortable with her living situation.  Given lack of tenderness to the abdomen or pelvis in conjunction with  lack of recent STI symptoms, exceedingly low concern for PID/STIs at this time.  Pelvic exam/swabs deferred.  Lab results are largely within normal limits aside from mild leukocytosis of 14.  Normal renal function, electrolytes.  Urinalysis without signs of infection.  Upon reassessment, patient resting comfortably with stable vital signs, remains afebrile.  Abdominal exam remains benign.  Tolerating p.o. intake without difficulty.  Given reassuring workup, stable vital signs, and well-appearing patient with benign abdominal exam, exceedingly low concern for severe life-threatening abdominal or pelvic pathology at this time.  Patient deemed safe and stable for discharge.  Strict return precautions discussed, instructed to follow-up with primary care provider.  Provided with prescription for Zofran ODT's to be taken as needed for nausea and vomiting.  No additional concerns voiced at this time.  Disposition:  I discussed the plan for discharge with the patient and/or their surrogate at bedside prior to discharge and they were in agreement with the plan and verbalized understanding of the return precautions provided. All questions answered to the best of my ability. Ultimately, the patient was discharged in stable condition with stable vital signs. I am reassured that they are capable of close follow up and good social support at home.   Clinical Impression:  1. Generalized abdominal pain     Rx / DC Orders ED Discharge Orders          Ordered    ondansetron (ZOFRAN) 4 MG tablet  Every 8 hours PRN        08/29/22 1126            The plan for this patient was discussed with Dr. Rosalia Hammers, who voiced agreement and who oversaw evaluation and treatment of this patient.   Clinical  Complexity A medically appropriate history, review of systems, and physical exam was performed.  My independent interpretations of EKG, labs, and radiology are documented in the ED course above.   Click here for ABCD2,  HEART and other calculatorsREFRESH Note before signing   Patient's presentation is most consistent with acute complicated illness / injury requiring diagnostic workup.  Medical Decision Making Amount and/or Complexity of Data Reviewed Labs: ordered.  Risk Prescription drug management.    HPI/ROS      See MDM section for pertinent HPI and ROS. A complete ROS was performed with pertinent positives/negatives noted above.   Past Medical History:  Diagnosis Date   Abnormal Pap smear    Hyperemesis arising during pregnancy    PID (acute pelvic inflammatory disease)     Past Surgical History:  Procedure Laterality Date   COLPOSCOPY VULVA W/ BIOPSY     KNEE SURGERY     right side   WISDOM TOOTH EXTRACTION        Physical Exam   Vitals:   08/29/22 0738 08/29/22 0741 08/29/22 1045  BP: (!) 129/104  100/61  Pulse: 99  70  Resp: 19  14  Temp: 98.5 F (36.9 C)  (!) 97.5 F (36.4 C)  TempSrc:   Oral  SpO2: 100%  98%  Weight:  43.1 kg   Height:  4\' 11"  (1.499 m)     Physical Exam Vitals and nursing note reviewed.  Constitutional:      General: She is not in acute distress.    Appearance: She is well-developed.     Comments: Somnolent but arousable  HENT:     Head: Normocephalic and atraumatic.  Eyes:     Conjunctiva/sclera: Conjunctivae normal.  Cardiovascular:     Rate and Rhythm: Normal rate and regular rhythm.     Heart sounds: No murmur heard. Pulmonary:     Effort: Pulmonary effort is normal. No respiratory distress.     Breath sounds: Normal breath sounds.  Abdominal:     Palpations: Abdomen is soft.     Tenderness: There is no abdominal tenderness.  Musculoskeletal:        General: No swelling.     Cervical back: Neck supple.  Skin:    General: Skin is warm and dry.  Psychiatric:        Mood and Affect: Mood normal.    Starleen Arms, MD Department of Emergency Medicine   Please note that this documentation was produced with the assistance of  voice-to-text technology and may contain errors.    Dyanne Iha, MD 08/29/22 1127    Margarita Grizzle, MD 08/29/22 [phone]

## 2022-08-29 NOTE — Discharge Instructions (Signed)
You were seen today for nausea, vomiting, abdominal pain. While you were here we monitored your vitals, preformed a physical exam, and took lab work and urine. These were all reassuring and there is no indication for any further testing or intervention in the emergency department at this time.   Things to do:  - Follow up with your primary care provider within the next 1-2 weeks -Return to the emergency department if you have any new or worsening symptoms including fevers, severe abdominal pain, urinary pain, discharge, or if you have any other concerns.

## 2022-10-05 ENCOUNTER — Encounter (HOSPITAL_COMMUNITY): Payer: Self-pay

## 2022-10-05 ENCOUNTER — Other Ambulatory Visit: Payer: Self-pay

## 2022-10-05 ENCOUNTER — Emergency Department (HOSPITAL_COMMUNITY)
Admission: EM | Admit: 2022-10-05 | Discharge: 2022-10-05 | Disposition: A | Discharge status: Home / Self Care | Payer: 59 | Attending: Student | Admitting: Student

## 2022-10-05 DIAGNOSIS — S90822A Blister (nonthermal), left foot, initial encounter: Secondary | ICD-10-CM | POA: Diagnosis not present

## 2022-10-05 DIAGNOSIS — X58XXXA Exposure to other specified factors, initial encounter: Secondary | ICD-10-CM | POA: Insufficient documentation

## 2022-10-05 DIAGNOSIS — M79672 Pain in left foot: Secondary | ICD-10-CM | POA: Diagnosis present

## 2022-10-05 MED ORDER — IBUPROFEN 600 MG PO TABS
600.0000 mg | ORAL_TABLET | Freq: Four times a day (QID) | ORAL | 0 refills | Status: AC | PRN
Start: 2022-10-05 — End: ?

## 2022-10-05 MED ORDER — IBUPROFEN 400 MG PO TABS
600.0000 mg | ORAL_TABLET | Freq: Once | ORAL | Status: AC
  Administered 2022-10-05: 600 mg via ORAL
  Filled 2022-10-05: qty 1

## 2022-10-05 MED ORDER — CEPHALEXIN 250 MG PO CAPS
500.0000 mg | ORAL_CAPSULE | Freq: Once | ORAL | Status: AC
  Administered 2022-10-05: 500 mg via ORAL
  Filled 2022-10-05: qty 2

## 2022-10-05 MED ORDER — CEPHALEXIN 500 MG PO CAPS
500.0000 mg | ORAL_CAPSULE | Freq: Four times a day (QID) | ORAL | 0 refills | Status: AC
Start: 2022-10-05 — End: ?

## 2022-10-05 NOTE — ED Notes (Signed)
Pt has callous' and a little blistering underneath and on toes and toes and ball of feet are red and a little swollen.

## 2022-10-05 NOTE — ED Provider Notes (Signed)
[place] EMERGENCY DEPARTMENT AT [facility] Provider Note   CSN: [id number] Arrival date & time: 10/05/22  1251     History  Chief Complaint  Patient presents with   Foot Pain    Kristie Williams is a 32 y.o. female.  Patient to ED for evaluation of left foot pain x 1-2 days. No known injury. She is homeless and walks long distances everyday. No other medical conditions. No fever.   The history is provided by the patient. No language interpreter was used.  Foot Pain       Home Medications Prior to Admission medications   Medication Sig Start Date End Date Taking? Authorizing Provider  cephALEXin (KEFLEX) 500 MG capsule Take 1 capsule (500 mg total) by mouth 4 (four) times daily. 10/05/22  Yes Petrona Wyeth, Melvenia Beam, PA-C  ibuprofen (ADVIL) 600 MG tablet Take 1 tablet (600 mg total) by mouth every 6 (six) hours as needed. 10/05/22  Yes Jarnell Cordaro, Melvenia Beam, PA-C  nystatin cream (MYCOSTATIN) Apply 1 Application topically 2 (two) times daily. Patient not taking: Reported on 05/22/2022 05/22/22   Mayers, Cari S, PA-C  ondansetron (ZOFRAN) 4 MG tablet Take 1 tablet (4 mg total) by mouth every 8 (eight) hours as needed for nausea or vomiting. 08/29/22   Dyanne Iha, MD      Allergies    Kiwi extract    Review of Systems   Review of Systems  Physical Exam Updated Vital Signs BP 109/78 (BP Location: Left Arm)   Pulse 77   Temp 99.6 F (37.6 C) (Oral)   Resp 18   Ht 4\' 11"  (1.499 m)   Wt 40.8 kg   LMP 09/10/2022 (Approximate)   SpO2 100%   BMI 18.18 kg/m  Physical Exam Vitals and nursing note reviewed.  Constitutional:      Appearance: Normal appearance. She is not ill-appearing.  Cardiovascular:     Pulses: Normal pulses.  Pulmonary:     Effort: Pulmonary effort is normal.  Musculoskeletal:        General: Normal range of motion.  Skin:    Findings: Erythema present.     Comments: Multiple intact blisters over left 3rd and 5th toes. Large purulent blister at the  plantar base of #3. Mild erythema of the dorsal foot. No significant swelling.   Neurological:     General: No focal deficit present.     Mental Status: She is alert and oriented to person, place, and time.     ED Results / Procedures / Treatments   Labs (all labs ordered are listed, but only abnormal results are displayed) Labs Reviewed - No data to display  EKG None  Radiology No results found.  Procedures Procedures    Medications Ordered in ED Medications  ibuprofen (ADVIL) tablet 600 mg (has no administration in time range)  cephALEXin (KEFLEX) capsule 500 mg (has no administration in time range)    ED Course/ Medical Decision Making/ A&P Clinical Course as of 10/05/22 1435  Sat Oct 05, 2022  1431 Patient with blisters to left foot around toes likely from excessive walking. Homeless. No fever. Surrounding erythema is suggestive of early infection. Will start Keflex, ibuprofen. Foot clean and bandaged. Post-op shoe provided.  [SU]    Clinical Course User Index [SU] Elpidio Anis, PA-C                                 Medical Decision  Making          Final Clinical Impression(s) / ED Diagnoses Final diagnoses:  Blister of left foot, initial encounter    Rx / DC Orders ED Discharge Orders          Ordered    cephALEXin (KEFLEX) 500 MG capsule  4 times daily        10/05/22 1434    ibuprofen (ADVIL) 600 MG tablet  Every 6 hours PRN        10/05/22 1434              Elpidio Anis, PA-C 10/05/22 1435    Kommor, Cetronia, MD 10/05/22 1843

## 2022-10-05 NOTE — Progress Notes (Signed)
Orthopedic Tech Progress Note Patient Details:  Kristie KOHLMAN [birth year] [id number]  Ortho Devices Type of Ortho Device: Postop shoe/boot Ortho Device/Splint Location: LLE Ortho Device/Splint Interventions: Application   Post Interventions Patient Tolerated: Well  Issa Kosmicki E Arnol Mcgibbon 10/05/2022, 6:01 PM

## 2022-10-05 NOTE — ED Triage Notes (Signed)
Pt BIB [facility]. Pt presents with L foot pain that started today. EMS reports blisters under the toes.
